# Patient Record
Sex: Female | Born: 1945
Health system: Southern US, Community
[De-identification: ages and names within clinical notes are randomized; demographics above are authoritative.]

## PROBLEM LIST (undated history)

## (undated) DIAGNOSIS — E039 Hypothyroidism, unspecified: Secondary | ICD-10-CM

## (undated) DIAGNOSIS — R351 Nocturia: Secondary | ICD-10-CM

## (undated) DIAGNOSIS — T4145XA Adverse effect of unspecified anesthetic, initial encounter: Secondary | ICD-10-CM

## (undated) DIAGNOSIS — K649 Unspecified hemorrhoids: Secondary | ICD-10-CM

## (undated) DIAGNOSIS — K219 Gastro-esophageal reflux disease without esophagitis: Secondary | ICD-10-CM

## (undated) DIAGNOSIS — I10 Essential (primary) hypertension: Secondary | ICD-10-CM

## (undated) DIAGNOSIS — T8859XA Other complications of anesthesia, initial encounter: Secondary | ICD-10-CM

## (undated) DIAGNOSIS — C210 Malignant neoplasm of anus, unspecified: Secondary | ICD-10-CM

## (undated) DIAGNOSIS — E119 Type 2 diabetes mellitus without complications: Secondary | ICD-10-CM

## (undated) HISTORY — DX: Essential (primary) hypertension: I10

## (undated) HISTORY — PX: APPENDECTOMY: SHX54

## (undated) HISTORY — DX: Unspecified hemorrhoids: K64.9

## (undated) HISTORY — PX: WISDOM TOOTH EXTRACTION: SHX21

---

## 1997-10-10 ENCOUNTER — Ambulatory Visit (HOSPITAL_COMMUNITY): Admission: RE | Admit: 1997-10-10 | Discharge: 1997-10-10 | Payer: Self-pay | Admitting: Family Medicine

## 1998-07-31 ENCOUNTER — Emergency Department (HOSPITAL_COMMUNITY): Admission: EM | Admit: 1998-07-31 | Discharge: 1998-07-31 | Payer: Self-pay

## 1999-02-06 ENCOUNTER — Encounter: Payer: Self-pay | Admitting: Family Medicine

## 1999-02-06 ENCOUNTER — Ambulatory Visit (HOSPITAL_COMMUNITY): Admission: RE | Admit: 1999-02-06 | Discharge: 1999-02-06 | Payer: Self-pay | Admitting: Family Medicine

## 2001-06-06 ENCOUNTER — Encounter: Payer: Self-pay | Admitting: Family Medicine

## 2001-06-06 ENCOUNTER — Encounter: Admission: RE | Admit: 2001-06-06 | Discharge: 2001-06-06 | Payer: Self-pay | Admitting: Family Medicine

## 2002-06-08 ENCOUNTER — Ambulatory Visit (HOSPITAL_COMMUNITY): Admission: RE | Admit: 2002-06-08 | Discharge: 2002-06-08 | Payer: Self-pay | Admitting: Family Medicine

## 2002-06-08 ENCOUNTER — Encounter: Payer: Self-pay | Admitting: Family Medicine

## 2004-02-20 ENCOUNTER — Encounter: Admission: RE | Admit: 2004-02-20 | Discharge: 2004-02-20 | Payer: Self-pay | Admitting: Occupational Medicine

## 2004-10-03 ENCOUNTER — Other Ambulatory Visit: Admission: RE | Admit: 2004-10-03 | Discharge: 2004-10-03 | Payer: Self-pay | Admitting: Family Medicine

## 2005-10-12 ENCOUNTER — Other Ambulatory Visit: Admission: RE | Admit: 2005-10-12 | Discharge: 2005-10-12 | Payer: Self-pay | Admitting: Family Medicine

## 2005-12-11 ENCOUNTER — Encounter: Admission: RE | Admit: 2005-12-11 | Discharge: 2005-12-11 | Payer: Self-pay | Admitting: Family Medicine

## 2006-04-15 ENCOUNTER — Encounter: Admission: RE | Admit: 2006-04-15 | Discharge: 2006-04-15 | Payer: Self-pay | Admitting: Family Medicine

## 2006-04-26 ENCOUNTER — Encounter: Admission: RE | Admit: 2006-04-26 | Discharge: 2006-04-26 | Payer: Self-pay | Admitting: Family Medicine

## 2006-06-11 ENCOUNTER — Encounter: Admission: RE | Admit: 2006-06-11 | Discharge: 2006-06-11 | Payer: Self-pay | Admitting: Otolaryngology

## 2006-07-23 ENCOUNTER — Encounter: Admission: RE | Admit: 2006-07-23 | Discharge: 2006-07-23 | Payer: Self-pay | Admitting: Family Medicine

## 2008-01-30 ENCOUNTER — Other Ambulatory Visit: Admission: RE | Admit: 2008-01-30 | Discharge: 2008-01-30 | Payer: Self-pay | Admitting: Family Medicine

## 2008-02-02 ENCOUNTER — Ambulatory Visit (HOSPITAL_COMMUNITY): Admission: RE | Admit: 2008-02-02 | Discharge: 2008-02-02 | Payer: Self-pay | Admitting: Family Medicine

## 2008-05-17 ENCOUNTER — Ambulatory Visit (HOSPITAL_BASED_OUTPATIENT_CLINIC_OR_DEPARTMENT_OTHER): Admission: RE | Admit: 2008-05-17 | Discharge: 2008-05-17 | Payer: Self-pay | Admitting: Orthopedic Surgery

## 2008-05-17 HISTORY — PX: OTHER SURGICAL HISTORY: SHX169

## 2008-05-23 ENCOUNTER — Encounter (INDEPENDENT_AMBULATORY_CARE_PROVIDER_SITE_OTHER): Payer: Self-pay | Admitting: Orthopedic Surgery

## 2008-05-23 ENCOUNTER — Ambulatory Visit (HOSPITAL_COMMUNITY): Admission: RE | Admit: 2008-05-23 | Discharge: 2008-05-23 | Payer: Self-pay | Admitting: Orthopedic Surgery

## 2008-05-23 ENCOUNTER — Ambulatory Visit: Payer: Self-pay | Admitting: *Deleted

## 2008-06-14 ENCOUNTER — Encounter: Admission: RE | Admit: 2008-06-14 | Discharge: 2008-09-12 | Payer: Self-pay | Admitting: Orthopedic Surgery

## 2009-02-19 ENCOUNTER — Ambulatory Visit (HOSPITAL_COMMUNITY): Admission: RE | Admit: 2009-02-19 | Discharge: 2009-02-19 | Payer: Self-pay | Admitting: Family Medicine

## 2009-02-22 ENCOUNTER — Encounter: Admission: RE | Admit: 2009-02-22 | Discharge: 2009-02-22 | Payer: Self-pay | Admitting: Family Medicine

## 2010-01-30 ENCOUNTER — Other Ambulatory Visit: Admission: RE | Admit: 2010-01-30 | Discharge: 2010-01-30 | Payer: Self-pay | Admitting: Family Medicine

## 2010-02-25 ENCOUNTER — Ambulatory Visit (HOSPITAL_COMMUNITY): Admission: RE | Admit: 2010-02-25 | Discharge: 2010-02-25 | Payer: Self-pay | Admitting: Family Medicine

## 2010-07-20 ENCOUNTER — Encounter: Payer: Self-pay | Admitting: Family Medicine

## 2010-07-21 ENCOUNTER — Encounter: Payer: Self-pay | Admitting: Family Medicine

## 2010-11-11 NOTE — Op Note (Signed)
Destiny Decker, Destiny Decker                  ACCOUNT NO.:  [id number]   MEDICAL RECORD NO.:  [id number]          PATIENT TYPE:  AMB   LOCATION:  [facility]                          FACILITY:  [facility]   PHYSICIAN:  Rodney A. Mortenson, M.D.DATE OF BIRTH:  September 18, 1945   DATE OF PROCEDURE:  05/17/2008  DATE OF DISCHARGE:                               OPERATIVE REPORT   JUSTIFICATION:  A 65 year old female in February 2006, underwent  debridement of lateral meniscus, left knee and debridement of high-grade  chondromalacia, left patella and medial femoral condyle with a tear in  the anterior horn of meniscus at that time.  The patient was not having  pain beginning of this year, and an MRI done in March to rule out  avascular necrosis showed extensive cartilage loss in the posterior  aspect of patella and a new tear of the anterior horn of the lateral  meniscus.  Because of persistent pain and discomfort, she is now  admitted for arthroscopic evaluation and treatment.  Complications  discussed preoperatively.  Questions answered and encouraged.  It was  clearly outlined that if most of pain was due to the arthritic changes,  the debridement of the tear would be insufficient to relieve all her  pain.  She does have a ganglion here and wishes to have intervention.   JUSTIFICATION FOR OUTPATIENT SURGERY:  Minimal morbidity.   PREOPERATIVE DIAGNOSES:  1. Necrotic osteoarthritis, left knee.  2. Tear of anterior horn of lateral meniscus, left knee with a      ganglion cyst underneath anterior horn of lateral meniscus.   POSTOPERATIVE DIAGNOSES:  1. Necrotic osteoarthritis, left knee.  2. Tear of anterior horn of lateral meniscus, left knee with a      ganglion cyst underneath anterior horn of lateral meniscus.   OPERATION:  Debride anterior horn of lateral meniscus and removal of  large ganglion cyst from anterior aspect of the knee.   SURGEON:  Lenard Galloway. Chaney Malling, MD   ANESTHESIA:  MAC.   PATHOLOGY:   With the arthroscope of the knee, a very careful examination  was undertaken.  There was early arthritic changes in the patellofemoral  joint.  The anterior cruciate ligament was normal.  The lateral  compartment had multiple horizontal tears over the anterior horn and  with compression of the anterior horn, fluid which squirt into the joint  underneath the lip of the meniscus, this looked like ganglion, fluid was  very thick gelatinous.  Arthroscope was placed on lateral compartment,  and there was early osteoarthritis with arthritic change over  weightbearing area of the medial femoral condyle and especially  medial  tibial plateau.  The medial meniscus was intact.   PROCEDURE:  The patient was placed on the operating table in supine  position with pneumatic tourniquet about the left upper thigh.  Left leg  was placed in a leg holder.  Entire left lower extremity was prepped  with DuraPrep and draped out in the usual manner.  An infusion cannula  was placed in the superior medial pouch and the knee was distended with  saline.  Anteromedial and anterolateral portal was made.  The  arthroscope was introduced.  The findings were described as above.  All  the pathology was seen in the lateral compartment.  There were multiple  horizontal tears in the anterior horn and through 2 different portals, a  small basket was inserted, and anterior horn was aggressively debrided.  Once this was accomplished to my satisfaction, intra-articular shaver  was introduced, and all remaining frayed edges of the anterior horn were  removed, there was nice transition in mid third of the medial meniscus.  Palpation on the dorsal surface of the remainder of anterior horn with  probe caused extrusion of gelatinous material.  A knife hole was placed  at the meniscal capsular junction at this level and conjoined this,  ganglion type fluid was expressed.  This was opened slightly more and  intra-articular shaver was  inserted through this incision, and the  entire ganglion was excised.  Ganglion was debrided on the undersurface  of the meniscus.  Complete decompression was achieved.  I was very  pleased with final surgical outcome.  Marcaine was then placed, and a  large bulky pressure dressing applied, and the patient returned to the  recovery room in excellent condition.  Technically, this procedure went  extremely well.   DISPOSITION:  1. Usual postop instructions.  2. Percocet for pain.  3. To my office on Wednesday.      Rodney A. Chaney Malling, M.D.  Electronically Signed     RAM/MEDQ  D:  05/17/2008  T:  05/18/2008  Job:  [id number]

## 2011-01-20 ENCOUNTER — Other Ambulatory Visit (HOSPITAL_COMMUNITY): Payer: Self-pay | Admitting: Family Medicine

## 2011-01-20 DIAGNOSIS — Z1231 Encounter for screening mammogram for malignant neoplasm of breast: Secondary | ICD-10-CM

## 2011-02-27 ENCOUNTER — Ambulatory Visit (HOSPITAL_COMMUNITY): Payer: BC Managed Care – PPO | Attending: Family Medicine

## 2011-03-16 ENCOUNTER — Ambulatory Visit (HOSPITAL_COMMUNITY): Payer: BC Managed Care – PPO | Attending: Family Medicine

## 2011-03-31 LAB — BASIC METABOLIC PANEL
CO2: 30
Calcium: 9.3
Chloride: 103
GFR calc Af Amer: >60
Sodium: 139

## 2011-03-31 LAB — POCT HEMOGLOBIN-HEMACUE: Hemoglobin: 14.2

## 2011-12-01 ENCOUNTER — Ambulatory Visit (HOSPITAL_COMMUNITY): Payer: BC Managed Care – PPO | Attending: Family Medicine

## 2011-12-30 ENCOUNTER — Ambulatory Visit (HOSPITAL_COMMUNITY)
Admission: RE | Admit: 2011-12-30 | Discharge: 2011-12-30 | Disposition: A | Discharge status: Home / Self Care | Payer: Medicare Other | Source: Ambulatory Visit | Attending: Family Medicine | Admitting: Family Medicine

## 2011-12-30 DIAGNOSIS — Z1231 Encounter for screening mammogram for malignant neoplasm of breast: Secondary | ICD-10-CM | POA: Insufficient documentation

## 2012-08-16 ENCOUNTER — Other Ambulatory Visit: Payer: Self-pay | Admitting: Gastroenterology

## 2012-08-24 ENCOUNTER — Encounter (INDEPENDENT_AMBULATORY_CARE_PROVIDER_SITE_OTHER): Payer: Self-pay

## 2012-08-25 ENCOUNTER — Ambulatory Visit (INDEPENDENT_AMBULATORY_CARE_PROVIDER_SITE_OTHER): Payer: Medicare PPO | Admitting: General Surgery

## 2012-08-25 ENCOUNTER — Encounter (INDEPENDENT_AMBULATORY_CARE_PROVIDER_SITE_OTHER): Payer: Self-pay | Admitting: General Surgery

## 2012-08-25 VITALS — BP 128/72 | HR 76 | Temp 97.8°F | Resp 16 | Ht 66.0 in | Wt 160.1 lb

## 2012-08-25 DIAGNOSIS — C44520 Squamous cell carcinoma of anal skin: Secondary | ICD-10-CM | POA: Insufficient documentation

## 2012-08-25 NOTE — Patient Instructions (Signed)
GETTING TO GOOD BOWEL HEALTH. Irregular bowel habits such as constipation can lead to many problems over time.  Having one soft bowel movement a day is the most important way to prevent further problems.  The anorectal canal is designed to handle stretching and feces to safely manage our ability to get rid of solid waste (feces, poop, stool) out of our body.  BUT, hard constipated stools can act like ripping concrete bricks causing inflamed hemorrhoids, anal fissures, abdominal pain and bloating.     The goal: ONE SOFT BOWEL MOVEMENT A DAY!  To have soft, regular bowel movements:    Drink at least 8 tall glasses of water a day.     Take plenty of fiber.  Fiber is the undigested part of plant food that passes into the colon, acting s "natures broom" to encourage bowel motility and movement.  Fiber can absorb and hold large amounts of water. This results in a larger, bulkier stool, which is soft and easier to pass. Work gradually over several weeks up to 6 servings a day of fiber (25g a day even more if needed) in the form of: o Vegetables -- Root (potatoes, carrots, turnips), leafy green (lettuce, salad greens, celery, spinach), or cooked high residue (cabbage, broccoli, etc) o Fruit -- Fresh (unpeeled skin & pulp), Dried (prunes, apricots, cherries, etc ),  or stewed ( applesauce)  o Whole grain breads, pasta, etc (whole wheat)  o Bran cereals    Bulking Agents -- This type of water-retaining fiber generally is easily obtained each day by one of the following:  o Psyllium bran -- The psyllium plant is remarkable because its ground seeds can retain so much water. This product is available as Metamucil, Konsyl, Effersyllium, Per Diem Fiber, or the less expensive generic preparation in drug and health food stores. Although labeled a laxative, it really is not a laxative.  o Methylcellulose -- This is another fiber derived from wood which also retains water. It is available as Citrucel. o Polyethylene Glycol  - and "artificial" fiber commonly called Miralax or Glycolax.  It is helpful for people with gassy or bloated feelings with regular fiber o Flax Seed - a less gassy fiber than psyllium   No reading or other relaxing activity while on the toilet. If bowel movements take longer than 5 minutes, you are too constipated   AVOID CONSTIPATION.  High fiber and water intake usually takes care of this.  Sometimes a laxative is needed to stimulate more frequent bowel movements, but    Laxatives are not a good long-term solution as it can wear the colon out. o Osmotics (Milk of Magnesia, Fleets phosphosoda, Magnesium citrate, MiraLax, GoLytely) are safer than  o Stimulants (Senokot, Castor Oil, Dulcolax, Ex Lax)    o Do not take laxatives for more than 7days in a row.    IF SEVERELY CONSTIPATED, try a Bowel Retraining Program: o Do not use laxatives.  o Eat a diet high in roughage, such as bran cereals and leafy vegetables.  o Drink six (6) ounces of prune or apricot juice each morning.  o Eat two (2) large servings of stewed fruit each day.  o Take one (1) heaping tablespoon of a psyllium-based bulking agent twice a day. Use sugar-free sweetener when possible to avoid excessive calories.  o Eat a normal breakfast.  o Set aside 15 minutes after breakfast to sit on the toilet, but do not strain to have a bowel movement.  o If you do   not have a bowel movement by the third day, use an enema and repeat the above steps.    Anal Cancer  What is anal cancer? Cancer describes a set of diseases in which normal cells in the body, through a series of genetic changes, become abnormal and lose the ability to control their growth. As cancers - also known as "malignancies" - grow, they invade the tissues around them (local invasion). They may also spread to other locations in the body via the blood vessels or lymphatic channels where they may implant and grow (metastases). The anus or anal canal is the passage that  connects the rectum, or last part of the large intestine, to the outside of the body. Anal cancer arises from the cells around the anal opening or in the anal canal just inside the anal opening. Anal cancer is often a type of cancer called "squamous cell carcinoma". Other rare types of cancer may also occur in the anal canal and these require consultation with your physician or surgeon to determine the appropriate evaluation and treatment.  Cells that are becoming malignant or "premalignant", but have not invaded deeper into the skin, are referred to as "high-grade anal intraepithelial neoplasia" or HGAIN (previously referred to by a number of different terms, including "high grade dysplasia", "carcinoma-in-situ", "anal intra-epithelial neoplasia grade III", "high-grade squamous intraepithelial lesion", or "Bowen's disease"). While this condition is likely a precursor to anal cancer, this is not anal cancer and is treated differently than anal cancer. Your physician or colon and rectal surgeon can help clarify the differences. How common is anal cancer? Anal cancer is fairly uncommon, and accounts for about 1-2% of cancers affecting the intestinal tract. Approximately one in 600 men and women will get anal cancer in their lifetime (this can be compared to 1 in 69 men and women who will developed colon and rectal cancer in their lifetime). Almost 6,000 new cases of anal cancer are now diagnosed each year in the [place] with about 2/3rds of the cases in women. Approximately 800 people will die of the disease each year. Unlike some cancers, the numbers of patients that develop anal cancer each year is slowly increasing, especially in some higher risk groups (see below).  Who is at risk? A risk factor is something that increases a person's chance of getting a disease. Anal cancer is commonly associated with infection with the human papilloma virus (HPV), but some anal cancers develop without this infection being  present. HPV can also cause warts in and around the anus as well as genital warts (on the penis in men and the vagina or cervix in women), but warts do not have to be present for anal cancer to develop. HPV is also associated with an increased risk of cervical and vaginal or vulvar cancer in women, penile cancer in men, as well as with some head and neck cancers in men and women. Having some of these cancers, especially cervical or vulvar cancer (or even pre-cancerous change in the cervix or vulva), can put people at increased risk for anal cancer - likely from the association with HPV infection.  Additional risk factors for anal cancer include: Age - While most of the cases of anal cancer develop in people over age 22, 1/3rd of the cases occur in patients that are younger than that.  Anal sex - People participating in anal sex, both men and women, are at increased risk.  Sexually transmitted diseases - Patients with multiple sex partners are at  higher risk of getting sexually transmitted diseases like HPV and HIV and are, therefore, at an increased risk of developing anal cancer.  Smoking - Harmful chemicals from smoking increases the risk of most cancers, including anal cancer.  Immunosuppression - People with weakened immune systems, such as transplant patients who must take drugs to suppress their immune systems and patients with HIV infection, are at higher risk.  Chronic local inflammation - People with long-standing anal fistulas or open wounds in the anal area are at a slightly higher risk.  Pelvic radiation - People who have had pelvic radiation therapy for rectal, prostate, bladder or cervical cancer are at increased risk. Can anal cancer be prevented? Few cancers can be totally prevented, but the risk of developing anal cancer may be decreased significantly by avoiding the risk factors listed above and by getting regular checkups. Avoiding anal sex and infection with HPV and HIV can reduce the  risk of developing anal cancer. Using condoms whenever having any kind of intercourse may reduce, but not eliminate, the risk of HPV infection. Smoking cessation lowers the risk of many types of cancer, including anal cancer. Vaccines against infection with certain types of HPV, especially in high-risk patients (see risk factors listed above), may also decrease the risk of developing anal cancer (in men and women). People who are at increased risk for anal cancer based on the risk factors listed above should talk to their doctors about consideration for anal cancer screening. This can include anal cytology or Pap tests (much like the Pap tests women undergo for cervical cancer screening). Early identification and treatment of premalignant lesions in the anus may also prevent the development of anal cancer.  What are the symptoms of anal cancer? Although 20% of anal cancers may be asymptomatic, many cases of anal cancer can be found early because they form in a part of the digestive tract that a doctor can see and reach easily. Anal cancers may cause symptoms such as:  Bleeding from the rectum or anus  The feeling of a lump or mass at the anal opening  Persistent or recurring pain in the anal area  Persistent or recurrent itching  Change in bowel habits (having more or fewer bowel movements) or increased straining during a bowel movement  Narrowing of the stools  Discharge or drainage (mucous or pus) from the anus  Swollen lymph nodes (glands) in the anal or groin areas These symptoms can also be caused by less serious conditions such as hemorrhoids, but you should never assume this. If you have any of these symptoms, see your doctor or colon and rectal surgeon. How is anal cancer diagnosed? Anal cancer is usually found on examination of the anal canal because of the presence of symptoms listed above, on routine yearly physical exams by a physician (rectal exam for prostate check or at the time of a  pelvic exam), or on screening tests such as those recommended for preventing or diagnosing colorectal cancer (for example: colonoscopy or lighted scope exam of the colon and rectum or yearly stool blood tests). Anoscopy, or examination of the anal canal with a small, lighted scope, may be performed as well to assess any abnormal findings. If an abnormal area in the anal canal is identified based on the doctor's exam, a biopsy will be performed to determine the diagnosis. If the diagnosis of anal cancer is confirmed, additional tests to determine the extent of the cancer may be recommended, which may include ultrasounds, Xrays, CT  scans, and/or PET scans.  How are anal cancers treated? Treatment for most cases of anal cancer is very effective in curing the cancer. There are 3 basic types of treatment used for anal cancer:  Surgery - an operation to remove the cancer  Radiation therapy - high-dose x-rays to kill cancer cells  Chemotherapy - giving drugs to kill cancer cells Combination therapy including radiation therapy and chemotherapy is now considered the standard treatment for most anal cancers. Occasionally, a very small or early tumor may be removed surgically (local excision) without the need for further treatment and with minimal damage to the anal sphincter muscles that are important for bowel control. On occasion, more major surgery to remove the anal cancer is needed, and this requires the creation of a colostomy where the bowel is brought out to the skin on the belly wall where a bag is attached to collect the fecal matter. Will I need a colostomy? The majority of patients treated for anal cancer will not need a colostomy. If the tumor does not respond completely to combination therapy, if it recurs after treatment, or if it is an unusual type of anal cancer, removal of the rectum and anus and creation of a permanent colostomy may be necessary. This operation is known as an abdominoperineal  resection (APR). What happens after treatment for anal cancer? Follow-up care to assess the results of treatment and to check for recurrence is very important. Most anal cancers are cured with combination therapy and/or surgery, so you should report any symptoms or problems to your doctor or surgeon right away. In addition, many tumors that recur may be successfully treated with surgery if they are caught early. A careful examination by an experienced physician or colon and rectal surgeon at regular intervals is the most important method of follow-up. Additional studies, such as certain types of scans (for example, CT or MRI) or ultrasounds, may also be recommended.  Conclusion Anal cancers are unusual tumors arising from the skin or lining of the anal canal. As with most cancers, early detection is associated with excellent survival. Most tumors are well-treated with combination chemotherapy and radiation. Recurrences, treatment failures, and advanced disease may require surgery. Follow the recommended screening examinations for anal and colorectal cancer and consult your doctor or colon and rectal surgeon early when any concerning symptoms occur.  What is a colon and rectal surgeon? Colon and rectal surgeons are experts in the surgical and non-surgical treatment of diseases of the colon, rectum and anus. They have completed advanced surgical training in the treatment of these diseases as well as full general surgical training. Board-certified colon and rectal surgeons complete residencies in general surgery and colon and rectal surgery, and pass intensive examinations conducted by the American Board of Surgery and the American Board of Colon and Rectal Surgery. They are well-versed in the treatment of both benign and malignant diseases of the colon, rectum and anus and are able to perform routine screening examinations and surgically treat conditions if indicated to do so. Thereasa ParkinAlgis Downs. Weston Settle, MD, FACS,  FASCRS, on behalf of the [employer] Public Relations Committee

## 2012-08-25 NOTE — Progress Notes (Signed)
Chief Complaint  Patient presents with  . New Evaluation    Squamous cell carcinoma    HISTORY: Destiny Decker is a 67 y.o. female who presents to the office with rectal irritation and a mass.  Other symptoms include bleeding (q 2-3 mo).  This had been occurring for years.  She has tried hemorrhoid creams in the past with some success.  Hard BM's makes the symptoms worse.   It is intermittent in nature.  Her bowel habits are irregular and her bowel movements are mostly hard.  Her fiber intake is moderate in her diet.  Her last colonoscopy was recently and showed diverticulosis, sigmoid polyp, anal polyp.  She does have prolapsing tissue.     Past Medical History  Diagnosis Date  . Menopause   . Hypertension   . Diabetes mellitus without complication   . Thyroid disease     Hypothyroidism  . Hemorrhoids   . Squamous cell carcinoma       Past Surgical History  Procedure Laterality Date  . Knee arthroscopy Left 2006, 2009  . Wisdom tooth extraction    . Appendectomy      When she was 67 years old.        Current Outpatient Prescriptions  Medication Sig Dispense Refill  . ONE TOUCH ULTRA TEST test strip daily.      Marland Kitchen amLODipine (NORVASC) 5 MG tablet Take 5 mg by mouth daily.      . Aspirin 81 MG EC tablet Take 81 mg by mouth daily.      . diclofenac (VOLTAREN) 75 MG EC tablet Take 75 mg by mouth 2 (two) times daily.      . fish oil-omega-3 fatty acids 1000 MG capsule Take 1 g by mouth 2 (two) times daily.      Marland Kitchen ibuprofen (ADVIL,MOTRIN) 200 MG tablet Take 600 mg by mouth as needed for pain.      Marland Kitchen levothyroxine (SYNTHROID, LEVOTHROID) 50 MCG tablet Take 50 mcg by mouth daily.      . metFORMIN (GLUCOPHAGE) 500 MG tablet Take 500 mg by mouth 2 (two) times daily with a meal.      . Omega-3 Fat Ac-Cholecalciferol (MINICAPS VITAMIN-D/OMEGA-3 PO) Take 1,000 Units by mouth daily.       No current facility-administered medications for this visit.      Allergies  Allergen Reactions  .  Penicillins Itching    In groin area only.      Family History  Problem Relation Age of Onset  . Diabetes Mother   . Cancer Sister     breast  . Hypertension Sister     History   Social History  . Marital Status: Married    Spouse Name: N/A    Number of Children: N/A  . Years of Education: N/A   Social History Main Topics  . Smoking status: Never Smoker   . Smokeless tobacco: Never Used  . Alcohol Use: No  . Drug Use: No  . Sexually Active: None   Other Topics Concern  . None   Social History Narrative  . None      REVIEW OF SYSTEMS - PERTINENT POSITIVES ONLY: Review of Systems - General ROS: negative for - chills, fever or weight loss Hematological and Lymphatic ROS: negative for - bleeding problems, blood clots or bruising Respiratory ROS: no cough, shortness of breath, or wheezing Cardiovascular ROS: no chest pain or dyspnea on exertion Gastrointestinal ROS: positive for - abdominal pain and gas/bloating negative for -  diarrhea or nausea/vomiting Genito-Urinary ROS: no dysuria, trouble voiding, or hematuria  EXAM: Filed Vitals:   08/25/12 1442  BP: 128/72  Pulse: 76  Temp: 97.8 F (36.6 C)  Resp: 16    General appearance: alert and cooperative Resp: clear to auscultation bilaterally Cardio: regular rate and rhythm GI: soft, non-tender; bowel sounds normal; no masses,  no organomegaly   Procedure: Anoscopy Surgeon: Maisie Fus Diagnosis: anal mass  Assistant: Glaspey After the risks and benefits were explained, verbal consent was obtained for above procedure  Anesthesia: none Findings: polypoid anal mass, L lateral position, mobile    ASSESSMENT AND PLAN: Destiny Decker is a 67 y.o. with a mass biopsied on colonoscopy that showed anal SCC.  On exam this area is discretely present.  She has a history of constipation and hard stools as well.  I have asked her to start a fiber supplement daily in preparation for wide local excision in a couple weeks.  We  will re-evaluate the cancer once we get her final pathology back.    The risks of the procedure are pain, bleeding, urinary retention, cancer recurrence and scarring.  I believe she understands these risks and agrees to proceed.     [name], MD Colon and Rectal Surgery / General Surgery [facility]      Visit Diagnoses: 1. Squamous cell carcinoma of anal skin     Primary Care Physician: Benita Stabile, MD

## 2012-09-07 ENCOUNTER — Encounter (HOSPITAL_BASED_OUTPATIENT_CLINIC_OR_DEPARTMENT_OTHER): Payer: Self-pay | Admitting: *Deleted

## 2012-09-08 ENCOUNTER — Encounter (HOSPITAL_BASED_OUTPATIENT_CLINIC_OR_DEPARTMENT_OTHER): Payer: Self-pay | Admitting: *Deleted

## 2012-09-09 ENCOUNTER — Encounter (HOSPITAL_BASED_OUTPATIENT_CLINIC_OR_DEPARTMENT_OTHER): Payer: Self-pay | Admitting: *Deleted

## 2012-09-09 NOTE — Progress Notes (Signed)
NPO AFTER MN. ARRIVES AT 0615. NEEDS ISTAT AND EKG. WILL TAKE NORVASC AND SYNTHROID AM OF SURG W/ SIP OF WATER AND DO FLEET ENEMA. [place] ORIGIN, PT SPEAKS AND UNDERSTANDS GOOD ENGLISH BUT UNSURE ABOUT DIRECTIONS OF FACILITY AND WHERE TO GET ENEMA, SPOKE W/ PT HUSBAND AND GAVE INSTRUCTIONS , VERBALIZED UNDERSTANDING. PT STATES SHE DOES NOT NEED INTERPRETOR DOS.

## 2012-09-11 NOTE — Anesthesia Preprocedure Evaluation (Addendum)
Anesthesia Evaluation  Patient identified by MRN, date of birth, ID band Patient awake    Reviewed: Allergy & Precautions, H&P , NPO status , Patient's Chart, lab work & pertinent test results  Airway Mallampati: II TM Distance: >3 FB Neck ROM: full    Dental no notable dental hx. (+) Dental Advisory Given and Missing All right lateral and side teeth are missing.  A few missing teeth on left side too.  Front OK:   Pulmonary neg pulmonary ROS,  breath sounds clear to auscultation  Pulmonary exam normal       Cardiovascular Exercise Tolerance: Good hypertension, Pt. on medications negative cardio ROS  Rhythm:regular Rate:Normal     Neuro/Psych negative neurological ROS  negative psych ROS   GI/Hepatic negative GI ROS, Neg liver ROS, GERD-  Controlled,  Endo/Other  negative endocrine ROSdiabetes, Well Controlled, Type 2, Oral Hypoglycemic AgentsHypothyroidism   Renal/GU negative Renal ROS  negative genitourinary   Musculoskeletal   Abdominal   Peds  Hematology negative hematology ROS (+)   Anesthesia Other Findings   Reproductive/Obstetrics negative OB ROS                          Anesthesia Physical Anesthesia Plan  ASA: III  Anesthesia Plan: General   Post-op Pain Management:    Induction: Intravenous  Airway Management Planned: Oral ETT  Additional Equipment:   Intra-op Plan:   Post-operative Plan: Extubation in OR  Informed Consent: I have reviewed the patients History and Physical, chart, labs and discussed the procedure including the risks, benefits and alternatives for the proposed anesthesia with the patient or authorized representative who has indicated his/her understanding and acceptance.   Dental Advisory Given  Plan Discussed with: CRNA and Surgeon  Anesthesia Plan Comments:        Anesthesia Quick Evaluation

## 2012-09-12 ENCOUNTER — Other Ambulatory Visit: Payer: Self-pay

## 2012-09-12 ENCOUNTER — Encounter (HOSPITAL_BASED_OUTPATIENT_CLINIC_OR_DEPARTMENT_OTHER)
Admission: RE | Disposition: A | Discharge status: Home / Self Care | Payer: Self-pay | Source: Ambulatory Visit | Attending: General Surgery

## 2012-09-12 ENCOUNTER — Encounter (HOSPITAL_BASED_OUTPATIENT_CLINIC_OR_DEPARTMENT_OTHER): Payer: Self-pay | Admitting: Anesthesiology

## 2012-09-12 ENCOUNTER — Ambulatory Visit (HOSPITAL_BASED_OUTPATIENT_CLINIC_OR_DEPARTMENT_OTHER)
Admission: RE | Admit: 2012-09-12 | Discharge: 2012-09-12 | Disposition: A | Discharge status: Home / Self Care | Payer: Medicare PPO | Source: Ambulatory Visit | Attending: General Surgery | Admitting: General Surgery

## 2012-09-12 ENCOUNTER — Ambulatory Visit (HOSPITAL_BASED_OUTPATIENT_CLINIC_OR_DEPARTMENT_OTHER): Payer: Medicare PPO | Admitting: Anesthesiology

## 2012-09-12 ENCOUNTER — Encounter (HOSPITAL_BASED_OUTPATIENT_CLINIC_OR_DEPARTMENT_OTHER): Payer: Self-pay | Admitting: *Deleted

## 2012-09-12 DIAGNOSIS — D013 Carcinoma in situ of anus and anal canal: Secondary | ICD-10-CM

## 2012-09-12 DIAGNOSIS — K6289 Other specified diseases of anus and rectum: Secondary | ICD-10-CM | POA: Insufficient documentation

## 2012-09-12 DIAGNOSIS — Z79899 Other long term (current) drug therapy: Secondary | ICD-10-CM | POA: Insufficient documentation

## 2012-09-12 DIAGNOSIS — E039 Hypothyroidism, unspecified: Secondary | ICD-10-CM | POA: Insufficient documentation

## 2012-09-12 DIAGNOSIS — K6282 Dysplasia of anus: Secondary | ICD-10-CM

## 2012-09-12 DIAGNOSIS — I10 Essential (primary) hypertension: Secondary | ICD-10-CM | POA: Insufficient documentation

## 2012-09-12 DIAGNOSIS — E119 Type 2 diabetes mellitus without complications: Secondary | ICD-10-CM | POA: Insufficient documentation

## 2012-09-12 HISTORY — DX: Hypothyroidism, unspecified: E03.9

## 2012-09-12 HISTORY — DX: Nocturia: R35.1

## 2012-09-12 HISTORY — DX: Malignant neoplasm of anus, unspecified: C21.0

## 2012-09-12 HISTORY — DX: Gastro-esophageal reflux disease without esophagitis: K21.9

## 2012-09-12 HISTORY — PX: TRANSANAL EXCISION OF RECTAL MASS: SHX6134

## 2012-09-12 LAB — POCT I-STAT 4, (NA,K, GLUC, HGB,HCT)
HCT: 42 % (ref 36.0–46.0)
Hemoglobin: 14.3 g/dL (ref 12.0–15.0)
Potassium: 3.8 mEq/L (ref 3.5–5.1)
Sodium: 141 mEq/L (ref 135–145)

## 2012-09-12 SURGERY — EXCISION, MASS, RECTUM, ANAL APPROACH
Anesthesia: General | Site: Anus | Wound class: Contaminated

## 2012-09-12 MED ORDER — BUPIVACAINE LIPOSOME 1.3 % IJ SUSP
INTRAMUSCULAR | Status: DC | PRN
  Administered 2012-09-12: 20 mL

## 2012-09-12 MED ORDER — PROPOFOL 10 MG/ML IV BOLUS
INTRAVENOUS | Status: DC | PRN
  Administered 2012-09-12: 180 mg via INTRAVENOUS

## 2012-09-12 MED ORDER — PSYLLIUM 28.3 % PO POWD: 1.0000 "application " | Freq: Two times a day (BID) | ORAL | Status: DC

## 2012-09-12 MED ORDER — KETOROLAC TROMETHAMINE 30 MG/ML IJ SOLN
INTRAMUSCULAR | Status: DC | PRN
  Administered 2012-09-12: 30 mg via INTRAVENOUS

## 2012-09-12 MED ORDER — LIDOCAINE 5 % EX OINT
TOPICAL_OINTMENT | CUTANEOUS | Status: DC | PRN
  Administered 2012-09-12: 1

## 2012-09-12 MED ORDER — FLEET ENEMA 7-19 GM/118ML RE ENEM
1.0000 | ENEMA | Freq: Once | RECTAL | Status: DC
  Filled 2012-09-12: qty 1

## 2012-09-12 MED ORDER — SODIUM CHLORIDE 0.9 % IJ SOLN
3.0000 mL | Freq: Two times a day (BID) | INTRAMUSCULAR | Status: DC
  Filled 2012-09-12: qty 3

## 2012-09-12 MED ORDER — SODIUM CHLORIDE 0.9 % IJ SOLN
3.0000 mL | INTRAMUSCULAR | Status: DC | PRN
  Filled 2012-09-12: qty 3

## 2012-09-12 MED ORDER — ACETIC ACID 5 % SOLN
Status: DC | PRN
  Administered 2012-09-12: 1 via TOPICAL

## 2012-09-12 MED ORDER — SODIUM CHLORIDE 0.9 % IV SOLN
250.0000 mL | INTRAVENOUS | Status: DC | PRN
  Filled 2012-09-12: qty 250

## 2012-09-12 MED ORDER — ONDANSETRON HCL 4 MG/2ML IJ SOLN
4.0000 mg | Freq: Four times a day (QID) | INTRAMUSCULAR | Status: DC | PRN
  Filled 2012-09-12: qty 2

## 2012-09-12 MED ORDER — ACETAMINOPHEN 325 MG PO TABS
650.0000 mg | ORAL_TABLET | ORAL | Status: DC | PRN
  Filled 2012-09-12: qty 2

## 2012-09-12 MED ORDER — DEXAMETHASONE SODIUM PHOSPHATE 4 MG/ML IJ SOLN
INTRAMUSCULAR | Status: DC | PRN
  Administered 2012-09-12: 10 mg via INTRAVENOUS

## 2012-09-12 MED ORDER — BUPIVACAINE LIPOSOME 1.3 % IJ SUSP
20.0000 mL | INTRAMUSCULAR | Status: DC
  Filled 2012-09-12: qty 20

## 2012-09-12 MED ORDER — OXYCODONE HCL 5 MG PO TABS: 5.0000 mg | ORAL_TABLET | ORAL | Status: DC | PRN

## 2012-09-12 MED ORDER — ACETAMINOPHEN 650 MG RE SUPP
650.0000 mg | RECTAL | Status: DC | PRN
  Filled 2012-09-12: qty 1

## 2012-09-12 MED ORDER — DOCUSATE SODIUM 100 MG PO CAPS: 100.0000 mg | ORAL_CAPSULE | Freq: Three times a day (TID) | ORAL | Status: DC

## 2012-09-12 MED ORDER — LACTATED RINGERS IV SOLN
INTRAVENOUS | Status: DC
  Filled 2012-09-12: qty 1000

## 2012-09-12 MED ORDER — SUCCINYLCHOLINE CHLORIDE 20 MG/ML IJ SOLN
INTRAMUSCULAR | Status: DC | PRN
  Administered 2012-09-12: 120 mg via INTRAVENOUS

## 2012-09-12 MED ORDER — ONDANSETRON HCL 4 MG/2ML IJ SOLN
INTRAMUSCULAR | Status: DC | PRN
  Administered 2012-09-12: 4 mg via INTRAVENOUS

## 2012-09-12 MED ORDER — FENTANYL CITRATE 0.05 MG/ML IJ SOLN
INTRAMUSCULAR | Status: DC | PRN
  Administered 2012-09-12: 100 ug via INTRAVENOUS
  Administered 2012-09-12 (×2): 50 ug via INTRAVENOUS

## 2012-09-12 MED ORDER — LACTATED RINGERS IV SOLN
INTRAVENOUS | Status: DC
  Administered 2012-09-12: 07:00:00 via INTRAVENOUS
  Filled 2012-09-12: qty 1000

## 2012-09-12 MED ORDER — FENTANYL CITRATE 0.05 MG/ML IJ SOLN
25.0000 ug | INTRAMUSCULAR | Status: DC | PRN
  Filled 2012-09-12: qty 1

## 2012-09-12 MED ORDER — BUPIVACAINE-EPINEPHRINE PF 0.5-1:200000 % IJ SOLN
INTRAMUSCULAR | Status: DC | PRN
  Administered 2012-09-12: 1 mL

## 2012-09-12 MED ORDER — LACTATED RINGERS IV SOLN
INTRAVENOUS | Status: DC | PRN
  Administered 2012-09-12: 07:00:00 via INTRAVENOUS

## 2012-09-12 MED ORDER — GLYCOPYRROLATE 0.2 MG/ML IJ SOLN
INTRAMUSCULAR | Status: DC | PRN
  Administered 2012-09-12: 0.2 mg via INTRAVENOUS

## 2012-09-12 MED ORDER — LIDOCAINE HCL (CARDIAC) 20 MG/ML IV SOLN
INTRAVENOUS | Status: DC | PRN
  Administered 2012-09-12: 100 mg via INTRAVENOUS

## 2012-09-12 MED ORDER — DIAZEPAM 5 MG PO TABS: 5.0000 mg | ORAL_TABLET | Freq: Four times a day (QID) | ORAL | Status: DC | PRN

## 2012-09-12 MED ORDER — OXYCODONE HCL 5 MG PO TABS
5.0000 mg | ORAL_TABLET | ORAL | Status: DC | PRN
  Filled 2012-09-12: qty 2

## 2012-09-12 MED ORDER — MIDAZOLAM HCL 5 MG/5ML IJ SOLN
INTRAMUSCULAR | Status: DC | PRN
  Administered 2012-09-12: 2 mg via INTRAVENOUS

## 2012-09-12 SURGICAL SUPPLY — 52 items
BENZOIN TINCTURE PRP APPL 2/3 (GAUZE/BANDAGES/DRESSINGS) ×2 IMPLANT
BLADE HEX COATED 2.75 (ELECTRODE) ×2 IMPLANT
BLADE SURG 10 STRL SS (BLADE) ×2 IMPLANT
BLADE SURG 15 STRL LF DISP TIS (BLADE) ×1 IMPLANT
BLADE SURG 15 STRL SS (BLADE) ×1
CANISTER SUCTION 2500CC (MISCELLANEOUS) ×2 IMPLANT
CLOTH BEACON ORANGE TIMEOUT ST (SAFETY) ×2 IMPLANT
COVER MAYO STAND STRL (DRAPES) ×2 IMPLANT
COVER TABLE BACK 60X90 (DRAPES) ×2 IMPLANT
DECANTER SPIKE VIAL GLASS SM (MISCELLANEOUS) ×2 IMPLANT
DRAPE LG THREE QUARTER DISP (DRAPES) ×4 IMPLANT
DRAPE PED LAPAROTOMY (DRAPES) ×2 IMPLANT
DRAPE UNDERBUTTOCKS STRL (DRAPE) IMPLANT
DRSG PAD ABDOMINAL 8X10 ST (GAUZE/BANDAGES/DRESSINGS) ×2 IMPLANT
ELECT BLADE 6.5 .24CM SHAFT (ELECTRODE) IMPLANT
ELECT REM PT RETURN 9FT ADLT (ELECTROSURGICAL) ×2
ELECTRODE REM PT RTRN 9FT ADLT (ELECTROSURGICAL) ×1 IMPLANT
GAUZE SPONGE 4X4 12PLY STRL LF (GAUZE/BANDAGES/DRESSINGS) ×2 IMPLANT
GAUZE SPONGE 4X4 16PLY XRAY LF (GAUZE/BANDAGES/DRESSINGS) IMPLANT
GAUZE VASELINE 3X9 (GAUZE/BANDAGES/DRESSINGS) IMPLANT
GLOVE BIO SURGEON STRL SZ 6.5 (GLOVE) ×4 IMPLANT
GLOVE BIO SURGEON STRL SZ7 (GLOVE) ×2 IMPLANT
GLOVE BIOGEL PI IND STRL 7.0 (GLOVE) ×4 IMPLANT
GLOVE BIOGEL PI INDICATOR 7.0 (GLOVE) ×4
GOWN PREVENTION PLUS LG XLONG (DISPOSABLE) ×2 IMPLANT
GOWN PREVENTION PLUS XLARGE (GOWN DISPOSABLE) ×2 IMPLANT
GOWN STRL REIN XL XLG (GOWN DISPOSABLE) ×2 IMPLANT
LEGGING LITHOTOMY PAIR STRL (DRAPES) IMPLANT
MARCAINE0.5% WITH EPI IMPLANT
NDL SAFETY ECLIPSE 18X1.5 (NEEDLE) IMPLANT
NEEDLE HYPO 18GX1.5 SHARP (NEEDLE)
NEEDLE HYPO 25X1 1.5 SAFETY (NEEDLE) ×2 IMPLANT
NS IRRIG 500ML POUR BTL (IV SOLUTION) ×2 IMPLANT
PACK BASIN DAY SURGERY FS (CUSTOM PROCEDURE TRAY) ×2 IMPLANT
PENCIL BUTTON HOLSTER BLD 10FT (ELECTRODE) ×2 IMPLANT
SCOPETTES 8  STERILE (MISCELLANEOUS) ×1
SCOPETTES 8 STERILE (MISCELLANEOUS) ×1 IMPLANT
SPONGE GAUZE 4X4 12PLY (GAUZE/BANDAGES/DRESSINGS) ×2 IMPLANT
SPONGE SURGIFOAM ABS GEL 12-7 (HEMOSTASIS) IMPLANT
SUT CHROMIC 2 0 SH (SUTURE) ×2 IMPLANT
SUT CHROMIC 3 0 SH 27 (SUTURE) IMPLANT
SUT MON AB 3-0 SH 27 (SUTURE) ×1
SUT MON AB 3-0 SH27 (SUTURE) ×1 IMPLANT
SUT VIC AB 4-0 P-3 18XBRD (SUTURE) IMPLANT
SUT VIC AB 4-0 P3 18 (SUTURE)
SYR BULB IRRIGATION 50ML (SYRINGE) ×2 IMPLANT
SYR CONTROL 10ML LL (SYRINGE) ×4 IMPLANT
TOWEL NATURAL 6PK STERILE (DISPOSABLE) ×2 IMPLANT
TOWEL OR 17X26 10 PK STRL BLUE (TOWEL DISPOSABLE) ×4 IMPLANT
TRAY DSU PREP LF (CUSTOM PROCEDURE TRAY) ×2 IMPLANT
TUBE CONNECTING 12X1/4 (SUCTIONS) ×2 IMPLANT
YANKAUER SUCT BULB TIP NO VENT (SUCTIONS) ×2 IMPLANT

## 2012-09-12 NOTE — Transfer of Care (Signed)
Immediate Anesthesia Transfer of Care Note  Patient: Destiny Decker  Procedure(s) Performed: Procedure(s) (LRB): TRANSANAL EXCISION OF RECTAL MASS (N/A) HIGH RESOLUTION ANOSCOPY (N/A)  Patient Location: PACU  Anesthesia Type: General  Level of Consciousness: awake, sedated, patient cooperative and responds to stimulation  Airway & Oxygen Therapy: Patient Spontanous Breathing and Patient connected to face mask oxygen  Post-op Assessment: Report given to PACU RN, Post -op Vital signs reviewed and stable and Patient moving all extremities  Post vital signs: Reviewed and stable  Complications: No apparent anesthesia complications

## 2012-09-12 NOTE — Interval H&P Note (Signed)
History and Physical Interval Note:  09/12/2012 7:14 AM  Destiny Decker  has presented today for surgery, with the diagnosis of anal squamous cell carcinoma  The various methods of treatment have been discussed with the patient and family. After consideration of risks, benefits and other options for treatment, the patient has consented to  Procedure(s): TRANSANAL EXCISION OF RECTAL MASS (N/A) HIGH RESOLUTION ANOSCOPY (N/A) as a surgical intervention .  The patient's history has been reviewed, patient examined, no change in status, stable for surgery.  I have reviewed the patient's chart and labs.  Questions were answered to the patient's satisfaction.     [name], MD  Colorectal and General Surgery [facility]

## 2012-09-12 NOTE — Anesthesia Procedure Notes (Signed)
Procedure Name: Intubation Date/Time: 09/12/2012 7:30 AM Performed by: Jessica Priest Pre-anesthesia Checklist: Patient identified, Emergency Drugs available, Suction available and Patient being monitored Patient Re-evaluated:Patient Re-evaluated prior to inductionOxygen Delivery Method: Circle System Utilized Preoxygenation: Pre-oxygenation with 100% oxygen Intubation Type: IV induction Ventilation: Mask ventilation without difficulty Laryngoscope Size: Mac and 3 Grade View: Grade I Tube type: Oral Tube size: 7.0 mm Number of attempts: 1 Airway Equipment and Method: stylet and oral airway Placement Confirmation: ETT inserted through vocal cords under direct vision,  positive ETCO2 and breath sounds checked- equal and bilateral Secured at: 22 (22) cm Tube secured with: Tape Dental Injury: Teeth and Oropharynx as per pre-operative assessment

## 2012-09-12 NOTE — H&P (View-Only) (Signed)
Chief Complaint  Patient presents with  . New Evaluation    Squamous cell carcinoma    HISTORY: Destiny Decker is a 67 y.o. female who presents to the office with rectal irritation and a mass.  Other symptoms include bleeding (q 2-3 mo).  This had been occurring for years.  She has tried hemorrhoid creams in the past with some success.  Hard BM's makes the symptoms worse.   It is intermittent in nature.  Her bowel habits are irregular and her bowel movements are mostly hard.  Her fiber intake is moderate in her diet.  Her last colonoscopy was recently and showed diverticulosis, sigmoid polyp, anal polyp.  She does have prolapsing tissue.     Past Medical History  Diagnosis Date  . Menopause   . Hypertension   . Diabetes mellitus without complication   . Thyroid disease     Hypothyroidism  . Hemorrhoids   . Squamous cell carcinoma       Past Surgical History  Procedure Laterality Date  . Knee arthroscopy Left 2006, 2009  . Wisdom tooth extraction    . Appendectomy      When she was 67 years old.        Current Outpatient Prescriptions  Medication Sig Dispense Refill  . ONE TOUCH ULTRA TEST test strip daily.      Marland Kitchen amLODipine (NORVASC) 5 MG tablet Take 5 mg by mouth daily.      . Aspirin 81 MG EC tablet Take 81 mg by mouth daily.      . diclofenac (VOLTAREN) 75 MG EC tablet Take 75 mg by mouth 2 (two) times daily.      . fish oil-omega-3 fatty acids 1000 MG capsule Take 1 g by mouth 2 (two) times daily.      Marland Kitchen ibuprofen (ADVIL,MOTRIN) 200 MG tablet Take 600 mg by mouth as needed for pain.      Marland Kitchen levothyroxine (SYNTHROID, LEVOTHROID) 50 MCG tablet Take 50 mcg by mouth daily.      . metFORMIN (GLUCOPHAGE) 500 MG tablet Take 500 mg by mouth 2 (two) times daily with a meal.      . Omega-3 Fat Ac-Cholecalciferol (MINICAPS VITAMIN-D/OMEGA-3 PO) Take 1,000 Units by mouth daily.       No current facility-administered medications for this visit.      Allergies  Allergen Reactions  .  Penicillins Itching    In groin area only.      Family History  Problem Relation Age of Onset  . Diabetes Mother   . Cancer Sister     breast  . Hypertension Sister     History   Social History  . Marital Status: Married    Spouse Name: N/A    Number of Children: N/A  . Years of Education: N/A   Social History Main Topics  . Smoking status: Never Smoker   . Smokeless tobacco: Never Used  . Alcohol Use: No  . Drug Use: No  . Sexually Active: None   Other Topics Concern  . None   Social History Narrative  . None      REVIEW OF SYSTEMS - PERTINENT POSITIVES ONLY: Review of Systems - General ROS: negative for - chills, fever or weight loss Hematological and Lymphatic ROS: negative for - bleeding problems, blood clots or bruising Respiratory ROS: no cough, shortness of breath, or wheezing Cardiovascular ROS: no chest pain or dyspnea on exertion Gastrointestinal ROS: positive for - abdominal pain and gas/bloating negative for -  diarrhea or nausea/vomiting Genito-Urinary ROS: no dysuria, trouble voiding, or hematuria  EXAM: Filed Vitals:   08/25/12 1442  BP: 128/72  Pulse: 76  Temp: 97.8 F (36.6 C)  Resp: 16    General appearance: alert and cooperative Resp: clear to auscultation bilaterally Cardio: regular rate and rhythm GI: soft, non-tender; bowel sounds normal; no masses,  no organomegaly   Procedure: Anoscopy Surgeon: Maisie Fus Diagnosis: anal mass  Assistant: Glaspey After the risks and benefits were explained, verbal consent was obtained for above procedure  Anesthesia: none Findings: polypoid anal mass, L lateral position, mobile    ASSESSMENT AND PLAN: Destiny Decker is a 67 y.o. with a mass biopsied on colonoscopy that showed anal SCC.  On exam this area is discretely present.  She has a history of constipation and hard stools as well.  I have asked her to start a fiber supplement daily in preparation for wide local excision in a couple weeks.  We  will re-evaluate the cancer once we get her final pathology back.    The risks of the procedure are pain, bleeding, urinary retention, cancer recurrence and scarring.  I believe she understands these risks and agrees to proceed.     [name], MD Colon and Rectal Surgery / General Surgery [facility]      Visit Diagnoses: 1. Squamous cell carcinoma of anal skin     Primary Care Physician: Benita Stabile, MD

## 2012-09-12 NOTE — Progress Notes (Signed)
Report not given to Lolita Rieger RN  due to not being able to receive report in phase 2  @ this time.

## 2012-09-12 NOTE — Progress Notes (Signed)
Dr Rica Mast informed of pt transferred to phase 2 w 02 @ 1L /min. Pt negative for pulmonary disease process, negative for smoking. Notified of pt receiving versed 2mg  & fentanyl intraop. No new orders & pt may remain in phase 2 until weaned off 02 & 02 sats 90% or greater.

## 2012-09-12 NOTE — Op Note (Signed)
09/12/2012  8:29 AM  PATIENT:  Destiny Decker  67 y.o. female  Patient Care Team: Benita Stabile, MD as PCP - General (Family Medicine)  PRE-OPERATIVE DIAGNOSIS:  anal squamous cell carcinoma  POST-OPERATIVE DIAGNOSIS:  anal squamous cell carcinoma  PROCEDURE:  Procedure(s): TRANSANAL EXCISION OF RECTAL MASS HIGH RESOLUTION ANOSCOPY WITH BIOPSY  SURGEON:  Surgeon(s): Romie Levee, MD  ASSISTANT: none   ANESTHESIA:   local and general  EBL: min     SPECIMEN:  Source of Specimen:  anal canal - see below  DISPOSITION OF SPECIMEN:  PATHOLOGY  COUNTS:  YES  PLAN OF CARE: Discharge to home after PACU  PATIENT DISPOSITION:  PACU - hemodynamically stable.  INDICATION: This is a 66 y.o. F with a anal canal lesion that was biopsied on routine colonoscopy and found to have SCC within it.  OR FINDINGS: L posterior mass, not fixed to underlying tissue, posterior anal canal lesions found on high resolution anoscopy  DESCRIPTION: The patient was identified in the preoperative holding area and taken to the OR.  General anesthesia was smoothly induced.  She was laid prone on the operating room table in jack knife position.  The patient was then prepped and draped in the usual sterile fashion. A surgical timeout was performed indicating the correct patient, procedure, positioning and preoperative antibioitics. SCDs were noted to be in place and functioning prior to the operation.   After this was completed, a digital exam was performed.  A Hill Ferguson anoscope was inserted into the anal canal and all 4 quadrants were inspected.  No other suspicious lesions were noted.  The mass occupied the Left anterior position and spread towards the left lateral portion of her anal canal.  A sponge was soaked in 5% acetic acid was placed inside the anal canal.  Another acetic acid soaked sponge was placed over the perianal region. This was allowed to soak for 2 minutes. The sponge was removed and the  perianal region was painted with betadine and evaluated.  There were no suspicious lesions noted.  The internal anal canal was evaluated via anoscopy with a Hill-Ferguson anoscope and painted with betadine.  There were several small lesions noted in the posterior anal canal and the lesion in the left anterior anal canal.  The posterior anal canal lesions were biopsied with scissors and the remaining areas were destroyed with electrocautery.  A Buie clamp was placed around the left anterior lesion and transected with a 10 blade scalpel.  The internal portion was sutured closed using a 2-0 chromic suture in a running fashion.  Additional left anterior and left lateral margins were taken above the dentate line and sent as separate specimens.  Please note that specimen labelled left posterior additional margin was actually left anterior additional margin.  After this was completed, The remaining anal tissue was sutured closed with a 3-0 Chromic suture in a running fashion.  One separate 3-0 Chromic suture was used for hemostasis of the perinanal tissue.  30ml of Exparel was infused into the anal canal for a rectal block.  Hemostasis was good at this time.  A sterile dressing was applied.  The patient was then awakened from anesthesia and sent to the postanesthesia care unit in stable condition. All counts were correct operating room staff.

## 2012-09-12 NOTE — Anesthesia Postprocedure Evaluation (Signed)
Anesthesia Post Note  Patient: Destiny Decker  Procedure(s) Performed: Procedure(s) (LRB): TRANSANAL EXCISION OF RECTAL MASS (N/A) HIGH RESOLUTION ANOSCOPY (N/A)  Anesthesia type: General  Patient location: PACU  Post pain: Pain level controlled  Post assessment: Post-op Vital signs reviewed  Last Vitals:  Filed Vitals:   09/12/12 0850  BP:   Pulse:   Temp: 36.4 C  Resp:     Post vital signs: Reviewed  Level of consciousness: sedated  Complications: No apparent anesthesia complications

## 2012-09-13 ENCOUNTER — Telehealth (INDEPENDENT_AMBULATORY_CARE_PROVIDER_SITE_OTHER): Payer: Self-pay

## 2012-09-13 ENCOUNTER — Encounter (HOSPITAL_BASED_OUTPATIENT_CLINIC_OR_DEPARTMENT_OTHER): Payer: Self-pay | Admitting: General Surgery

## 2012-09-13 NOTE — Telephone Encounter (Signed)
I called to give the pt the message about her path.  Her mail box is full.  I also was not able to get through her home #.

## 2012-09-13 NOTE — Telephone Encounter (Signed)
Message copied by Ivory Broad on Tue Sep 13, 2012  3:48 PM ------      Message from: [place], Broadus John      Created: Tue Sep 13, 2012 12:13 PM      Regarding: Pt results       She did not have cancer, just precancerous lesions.  Looks like we got everything.  We will just need to keep a close eye on it in the future.            AT      ----- Message -----         From: [facility]         Sent: 09/13/2012  10:38 AM           To: Romie Levee, MD                   ------

## 2012-09-14 ENCOUNTER — Telehealth (INDEPENDENT_AMBULATORY_CARE_PROVIDER_SITE_OTHER): Payer: Self-pay

## 2012-09-14 NOTE — Telephone Encounter (Signed)
Message copied by Ivory Broad on Wed Sep 14, 2012 10:05 AM ------      Message from: [place], Broadus John      Created: Tue Sep 13, 2012 12:13 PM      Regarding: Pt results       She did not have cancer, just precancerous lesions.  Looks like we got everything.  We will just need to keep a close eye on it in the future.            AT      ----- Message -----         From: [facility]         Sent: 09/13/2012  10:38 AM           To: Romie Levee, MD                   ------

## 2012-09-14 NOTE — Telephone Encounter (Signed)
I called again.  The home phone just rang and the cell phone was not able to allow voice messages.

## 2012-09-14 NOTE — Telephone Encounter (Signed)
I called the husband's cell and got him.  I told him the results.  He said the pt had a tough night with pain.  She got up this morning before he went to work and took some pain medicine.  She probably went back to work.  He will give her our phone # so she can call me back.

## 2012-09-23 ENCOUNTER — Encounter (INDEPENDENT_AMBULATORY_CARE_PROVIDER_SITE_OTHER): Payer: Self-pay

## 2012-09-28 ENCOUNTER — Encounter (INDEPENDENT_AMBULATORY_CARE_PROVIDER_SITE_OTHER): Payer: Self-pay | Admitting: General Surgery

## 2012-09-28 ENCOUNTER — Ambulatory Visit (INDEPENDENT_AMBULATORY_CARE_PROVIDER_SITE_OTHER): Payer: Medicare PPO | Admitting: General Surgery

## 2012-09-28 VITALS — BP 130/84 | HR 72 | Temp 97.6°F | Resp 12 | Ht 66.0 in | Wt 164.8 lb

## 2012-09-28 DIAGNOSIS — D013 Carcinoma in situ of anus and anal canal: Secondary | ICD-10-CM

## 2012-09-28 NOTE — Patient Instructions (Signed)
Return to the office in 3 months for recheck. 

## 2012-09-28 NOTE — Progress Notes (Signed)
Destiny Decker is a 67 y.o. female who is status post a hemorrhoidectomy and biopsy on 3/17.  She is doing well.  She still has a little pain with BM's, but this is getting better.    Objective: Filed Vitals:   09/28/12 0922  BP: 130/84  Pulse: 72  Temp: 97.6 F (36.4 C)  Resp: 12    General appearance: alert and cooperative GI: soft, non-tender; bowel sounds normal; no masses,  no organomegaly  Incision: healing well   Assessment: s/p  Patient Active Problem List  Diagnosis  . Squamous cell carcinoma of anal skin  . AIN grade III    Plan: Destiny Decker is a 67 y.o. F with AIN grade 2-3.  I will see her back in 3 months for a follow up exam and anoscopy.    [name], MD [facility], [place] [phone]   09/28/2012 9:54 AM

## 2012-12-23 ENCOUNTER — Ambulatory Visit (INDEPENDENT_AMBULATORY_CARE_PROVIDER_SITE_OTHER): Payer: Medicare PPO | Admitting: General Surgery

## 2012-12-26 ENCOUNTER — Ambulatory Visit (INDEPENDENT_AMBULATORY_CARE_PROVIDER_SITE_OTHER): Payer: Medicare PPO | Admitting: General Surgery

## 2012-12-26 ENCOUNTER — Other Ambulatory Visit (INDEPENDENT_AMBULATORY_CARE_PROVIDER_SITE_OTHER): Payer: Self-pay | Admitting: General Surgery

## 2012-12-26 VITALS — BP 118/70 | HR 74 | Resp 14 | Ht 66.0 in | Wt 164.8 lb

## 2012-12-26 DIAGNOSIS — K6282 Dysplasia of anus: Secondary | ICD-10-CM

## 2012-12-26 NOTE — Progress Notes (Signed)
Chief Complaint  Patient presents with  . Follow-up    reck rectum    HISTORY: Destiny Decker is a 67 y.o. female who presents to the office for f/u of her AIN.  This was found anal biopsy after hemorrhoidectomy.  She denies any new symptoms.  No bleeding or constipation  Past Medical History  Diagnosis Date  . Hypertension   . Diabetes mellitus without complication   . Hemorrhoids   . Hypothyroidism   . Anal squamous cell carcinoma   . Mild acid reflux   . Nocturia       Past Surgical History  Procedure Laterality Date  . Wisdom tooth extraction    . Left knee arthroscopy w/ debridement and removal ganglion cyst  05-17-2008  . Appendectomy  AGE 89  . Transanal excision of rectal mass N/A 09/12/2012    Procedure: TRANSANAL EXCISION OF RECTAL MASS;  Surgeon: Romie Levee, MD;  Location: [facility];  Service: General;  Laterality: N/A;      Current Outpatient Prescriptions  Medication Sig Dispense Refill  . amLODipine (NORVASC) 5 MG tablet Take 5 mg by mouth every morning.       . Aspirin 81 MG EC tablet Take 81 mg by mouth daily.      . calcium carbonate (TUMS - DOSED IN MG ELEMENTAL CALCIUM) 500 MG chewable tablet Chew 1 tablet by mouth daily.      . Cholecalciferol (VITAMIN D3) 1000 UNITS CAPS Take 1 capsule by mouth every morning.      . diazepam (VALIUM) 5 MG tablet Take 1 tablet (5 mg total) by mouth every 6 (six) hours as needed (rectal spasms, inability to urinate).  10 tablet  0  . diclofenac (VOLTAREN) 75 MG EC tablet Take 75 mg by mouth 2 (two) times daily.      Marland Kitchen docusate sodium (COLACE) 100 MG capsule Take 1 capsule (100 mg total) by mouth 3 (three) times daily.  60 capsule  2  . fish oil-omega-3 fatty acids 1000 MG capsule Take 1 g by mouth 2 (two) times daily.      Marland Kitchen ibuprofen (ADVIL,MOTRIN) 200 MG tablet Take 600 mg by mouth as needed for pain.      . Lancets (ONETOUCH ULTRASOFT) lancets       . levothyroxine (SYNTHROID, LEVOTHROID) 50 MCG tablet Take  50 mcg by mouth every morning.       . metFORMIN (GLUCOPHAGE) 500 MG tablet Take 500 mg by mouth 2 (two) times daily with a meal.      . ONE TOUCH ULTRA TEST test strip       . oxyCODONE (OXY IR/ROXICODONE) 5 MG immediate release tablet Take 1-2 tablets (5-10 mg total) by mouth every 4 (four) hours as needed for pain.  50 tablet  0  . Psyllium (DIETARY FIBER LAXATIVE) 28.3 % POWD Take 1 application by mouth 2 (two) times daily.       No current facility-administered medications for this visit.      Allergies  Allergen Reactions  . Penicillins Itching    In groin area only.      Family History  Problem Relation Age of Onset  . Diabetes Mother   . Cancer Sister     breast  . Hypertension Sister       History   Social History  . Marital Status: Married    Spouse Name: N/A    Number of Children: N/A  . Years of Education: N/A  Social History Main Topics  . Smoking status: Never Smoker   . Smokeless tobacco: Never Used  . Alcohol Use: No  . Drug Use: No  . Sexually Active: Not on file   Other Topics Concern  . Not on file   Social History Narrative  . No narrative on file       REVIEW OF SYSTEMS - PERTINENT POSITIVES ONLY: Review of Systems - General ROS: negative for - chills or fever Respiratory ROS: no cough, shortness of breath, or wheezing Cardiovascular ROS: no chest pain or dyspnea on exertion Gastrointestinal ROS: no abdominal pain, change in bowel habits, or black or bloody stools Genito-Urinary ROS: no dysuria, trouble voiding, or hematuria  EXAM: Filed Vitals:   12/26/12 1132  BP: 118/70  Pulse: 74  Resp: 14    Gen:  No acute distress.  Well nourished and well groomed.   Neurological: Alert and oriented to person, place, and time. Coordination normal.  Head: Normocephalic and atraumatic.  Eyes: Conjunctivae are normal. Pupils are equal, round, and reactive to light. No scleral icterus.  Neck: Normal range of motion. Neck supple. No tracheal  deviation or thyromegaly present.  No cervical lymphadenopathy. Cardiovascular: Normal rate, regular rhythm, normal heart sounds and intact distal pulses.  Exam reveals no gallop and no friction rub.  No murmur heard. Respiratory: Effort normal.  No respiratory distress. No chest wall tenderness. Breath sounds normal.  No wheezes, rales or rhonchi.  GI: Soft. Bowel sounds are normal. The abdomen is soft and nontender.  There is no rebound and no guarding.  Psychiatric: Normal mood and affect. Behavior is normal. Judgment and thought content normal.  Anorectal: deferred    ASSESSMENT AND PLAN: Destiny Decker is a 67 y.o. F with AIN 2-3 found on biopsy 3 months ago.  We will schedule her for HRA and ablation of any affected areas.  We discussed the risks of surgery, which are mainly pain, bleeding and recurrence.      [name], MD Colon and Rectal Surgery / General Surgery [facility]      Visit Diagnoses: 1. AIN grade II     Primary Care Physician: Benita Stabile, MD

## 2012-12-26 NOTE — Patient Instructions (Signed)
We will schedule a surgery to recheck your anal area for precancerous lesions.

## 2012-12-28 ENCOUNTER — Encounter (HOSPITAL_BASED_OUTPATIENT_CLINIC_OR_DEPARTMENT_OTHER): Payer: Self-pay | Admitting: *Deleted

## 2012-12-28 NOTE — Progress Notes (Addendum)
NPO AFTER MN WITH EXCEPTION WATER/ GATORADE UNTIL 0700. ARRIVE AT 1130. NEEDS ISTAT. CURRENT EKG IN EPIC AND CHART. WILL TAKE SYNTHROID AND NORVASC AM OF SURG W/ SIP OF WATER.

## 2013-01-04 ENCOUNTER — Encounter (HOSPITAL_BASED_OUTPATIENT_CLINIC_OR_DEPARTMENT_OTHER)
Admission: RE | Disposition: A | Discharge status: Home / Self Care | Payer: Self-pay | Source: Ambulatory Visit | Attending: General Surgery

## 2013-01-04 ENCOUNTER — Ambulatory Visit (HOSPITAL_BASED_OUTPATIENT_CLINIC_OR_DEPARTMENT_OTHER): Payer: Medicare PPO | Admitting: Anesthesiology

## 2013-01-04 ENCOUNTER — Encounter (HOSPITAL_BASED_OUTPATIENT_CLINIC_OR_DEPARTMENT_OTHER): Payer: Self-pay | Admitting: *Deleted

## 2013-01-04 ENCOUNTER — Ambulatory Visit (HOSPITAL_BASED_OUTPATIENT_CLINIC_OR_DEPARTMENT_OTHER)
Admission: RE | Admit: 2013-01-04 | Discharge: 2013-01-04 | Disposition: A | Discharge status: Home / Self Care | Payer: Medicare PPO | Source: Ambulatory Visit | Attending: General Surgery | Admitting: General Surgery

## 2013-01-04 ENCOUNTER — Encounter (HOSPITAL_BASED_OUTPATIENT_CLINIC_OR_DEPARTMENT_OTHER): Payer: Self-pay | Admitting: Anesthesiology

## 2013-01-04 DIAGNOSIS — D013 Carcinoma in situ of anus and anal canal: Secondary | ICD-10-CM

## 2013-01-04 DIAGNOSIS — K6282 Dysplasia of anus: Secondary | ICD-10-CM | POA: Insufficient documentation

## 2013-01-04 DIAGNOSIS — I10 Essential (primary) hypertension: Secondary | ICD-10-CM | POA: Insufficient documentation

## 2013-01-04 DIAGNOSIS — E039 Hypothyroidism, unspecified: Secondary | ICD-10-CM | POA: Insufficient documentation

## 2013-01-04 DIAGNOSIS — Z7982 Long term (current) use of aspirin: Secondary | ICD-10-CM | POA: Insufficient documentation

## 2013-01-04 DIAGNOSIS — E119 Type 2 diabetes mellitus without complications: Secondary | ICD-10-CM | POA: Insufficient documentation

## 2013-01-04 DIAGNOSIS — Z79899 Other long term (current) drug therapy: Secondary | ICD-10-CM | POA: Insufficient documentation

## 2013-01-04 HISTORY — DX: Type 2 diabetes mellitus without complications: E11.9

## 2013-01-04 HISTORY — DX: Other complications of anesthesia, initial encounter: T88.59XA

## 2013-01-04 HISTORY — PX: EXAMINATION UNDER ANESTHESIA: SHX1540

## 2013-01-04 HISTORY — DX: Adverse effect of unspecified anesthetic, initial encounter: T41.45XA

## 2013-01-04 LAB — POCT I-STAT 4, (NA,K, GLUC, HGB,HCT)
Glucose, Bld: 122 mg/dL — ABNORMAL HIGH (ref 70–99)
HCT: 42 % (ref 36.0–46.0)
Sodium: 142 mEq/L (ref 135–145)

## 2013-01-04 LAB — GLUCOSE, CAPILLARY: Glucose-Capillary: 120 mg/dL — ABNORMAL HIGH (ref 70–99)

## 2013-01-04 SURGERY — EXAM UNDER ANESTHESIA
Anesthesia: Monitor Anesthesia Care | Site: Anus | Wound class: Clean Contaminated

## 2013-01-04 MED ORDER — HYDROMORPHONE HCL PF 1 MG/ML IJ SOLN
0.2500 mg | INTRAMUSCULAR | Status: DC | PRN
  Filled 2013-01-04: qty 1

## 2013-01-04 MED ORDER — ONDANSETRON HCL 4 MG/2ML IJ SOLN
4.0000 mg | Freq: Four times a day (QID) | INTRAMUSCULAR | Status: DC | PRN
  Filled 2013-01-04: qty 2

## 2013-01-04 MED ORDER — SODIUM CHLORIDE 0.9 % IJ SOLN
3.0000 mL | INTRAMUSCULAR | Status: DC | PRN
  Filled 2013-01-04: qty 3

## 2013-01-04 MED ORDER — ACETIC ACID 5 % SOLN
Status: DC | PRN
  Administered 2013-01-04: 1 via TOPICAL

## 2013-01-04 MED ORDER — OXYCODONE HCL 5 MG PO TABS: 5.0000 mg | ORAL_TABLET | Freq: Four times a day (QID) | ORAL | Status: DC | PRN

## 2013-01-04 MED ORDER — ONDANSETRON HCL 4 MG/2ML IJ SOLN
INTRAMUSCULAR | Status: DC | PRN
  Administered 2013-01-04: 4 mg via INTRAVENOUS

## 2013-01-04 MED ORDER — SODIUM CHLORIDE 0.9 % IV SOLN
250.0000 mL | INTRAVENOUS | Status: DC | PRN
  Filled 2013-01-04: qty 250

## 2013-01-04 MED ORDER — LACTATED RINGERS IV SOLN
INTRAVENOUS | Status: DC
  Administered 2013-01-04: 15:00:00 via INTRAVENOUS
  Filled 2013-01-04: qty 1000

## 2013-01-04 MED ORDER — LIDOCAINE 5 % EX OINT
TOPICAL_OINTMENT | CUTANEOUS | Status: DC | PRN
  Administered 2013-01-04: 1

## 2013-01-04 MED ORDER — KETOROLAC TROMETHAMINE 30 MG/ML IJ SOLN
INTRAMUSCULAR | Status: DC | PRN
  Administered 2013-01-04: 30 mg via INTRAVENOUS

## 2013-01-04 MED ORDER — PROPOFOL 10 MG/ML IV EMUL
INTRAVENOUS | Status: DC | PRN
  Administered 2013-01-04: 50 ug/kg/min via INTRAVENOUS

## 2013-01-04 MED ORDER — ACETAMINOPHEN 650 MG RE SUPP
650.0000 mg | RECTAL | Status: DC | PRN
  Filled 2013-01-04: qty 1

## 2013-01-04 MED ORDER — ACETAMINOPHEN 325 MG PO TABS
650.0000 mg | ORAL_TABLET | ORAL | Status: DC | PRN
  Filled 2013-01-04: qty 2

## 2013-01-04 MED ORDER — FENTANYL CITRATE 0.05 MG/ML IJ SOLN
INTRAMUSCULAR | Status: DC | PRN
Start: 2013-01-04 — End: 2013-01-04
  Administered 2013-01-04 (×16): 12.5 ug via INTRAVENOUS

## 2013-01-04 MED ORDER — DEXAMETHASONE SODIUM PHOSPHATE 4 MG/ML IJ SOLN
INTRAMUSCULAR | Status: DC | PRN
  Administered 2013-01-04: 4 mg via INTRAVENOUS

## 2013-01-04 MED ORDER — PROMETHAZINE HCL 25 MG/ML IJ SOLN
6.2500 mg | INTRAMUSCULAR | Status: DC | PRN
  Filled 2013-01-04: qty 1

## 2013-01-04 MED ORDER — DOCUSATE SODIUM 100 MG PO CAPS: 100.0000 mg | ORAL_CAPSULE | Freq: Two times a day (BID) | ORAL | Status: AC

## 2013-01-04 MED ORDER — LACTATED RINGERS IV SOLN
INTRAVENOUS | Status: DC
  Administered 2013-01-04: 100 mL/h via INTRAVENOUS
  Filled 2013-01-04: qty 1000

## 2013-01-04 MED ORDER — BUPIVACAINE-EPINEPHRINE 0.25% -1:200000 IJ SOLN
INTRAMUSCULAR | Status: DC | PRN
  Administered 2013-01-04: 20 mL

## 2013-01-04 MED ORDER — LACTATED RINGERS IV SOLN
INTRAVENOUS | Status: DC | PRN
  Administered 2013-01-04: 12:00:00 via INTRAVENOUS

## 2013-01-04 MED ORDER — OXYCODONE HCL 5 MG PO TABS: 5.0000 mg | ORAL_TABLET | ORAL | Status: DC | PRN

## 2013-01-04 MED ORDER — OXYCODONE HCL 5 MG PO TABS
5.0000 mg | ORAL_TABLET | ORAL | Status: DC | PRN
  Filled 2013-01-04: qty 2

## 2013-01-04 MED ORDER — MIDAZOLAM HCL 5 MG/5ML IJ SOLN
INTRAMUSCULAR | Status: DC | PRN
  Administered 2013-01-04 (×2): 0.5 mg via INTRAVENOUS
  Administered 2013-01-04: 1 mg via INTRAVENOUS
  Administered 2013-01-04: 2 mg via INTRAVENOUS

## 2013-01-04 MED ORDER — SODIUM CHLORIDE 0.9 % IJ SOLN
3.0000 mL | Freq: Two times a day (BID) | INTRAMUSCULAR | Status: DC
  Filled 2013-01-04: qty 3

## 2013-01-04 SURGICAL SUPPLY — 52 items
BANDAGE GAUZE ELAST BULKY 4 IN (GAUZE/BANDAGES/DRESSINGS) ×4 IMPLANT
BENZOIN TINCTURE PRP APPL 2/3 (GAUZE/BANDAGES/DRESSINGS) ×4 IMPLANT
BLADE HEX COATED 2.75 (ELECTRODE) ×2 IMPLANT
BLADE SURG 10 STRL SS (BLADE) IMPLANT
BLADE SURG 15 STRL LF DISP TIS (BLADE) ×1 IMPLANT
BLADE SURG 15 STRL SS (BLADE) ×1
BRIEF STRETCH FOR OB PAD LRG (UNDERPADS AND DIAPERS) ×2 IMPLANT
CANISTER SUCTION 2500CC (MISCELLANEOUS) ×2 IMPLANT
CLOTH BEACON ORANGE TIMEOUT ST (SAFETY) ×2 IMPLANT
COVER MAYO STAND STRL (DRAPES) ×2 IMPLANT
COVER TABLE BACK 60X90 (DRAPES) ×2 IMPLANT
DECANTER SPIKE VIAL GLASS SM (MISCELLANEOUS) IMPLANT
DRAPE LG THREE QUARTER DISP (DRAPES) ×4 IMPLANT
DRAPE PED LAPAROTOMY (DRAPES) ×2 IMPLANT
DRAPE UNDERBUTTOCKS STRL (DRAPE) IMPLANT
DRSG PAD ABDOMINAL 8X10 ST (GAUZE/BANDAGES/DRESSINGS) IMPLANT
ELECT BLADE 6.5 .24CM SHAFT (ELECTRODE) IMPLANT
ELECT REM PT RETURN 9FT ADLT (ELECTROSURGICAL) ×2
ELECTRODE REM PT RTRN 9FT ADLT (ELECTROSURGICAL) ×1 IMPLANT
GAUZE SPONGE 4X4 12PLY STRL LF (GAUZE/BANDAGES/DRESSINGS) ×2 IMPLANT
GAUZE SPONGE 4X4 16PLY XRAY LF (GAUZE/BANDAGES/DRESSINGS) ×2 IMPLANT
GAUZE VASELINE 3X9 (GAUZE/BANDAGES/DRESSINGS) IMPLANT
GLOVE BIO SURGEON STRL SZ 6.5 (GLOVE) ×2 IMPLANT
GLOVE BIOGEL PI IND STRL 7.0 (GLOVE) ×1 IMPLANT
GLOVE BIOGEL PI INDICATOR 7.0 (GLOVE) ×1
GLOVE INDICATOR 7.0 STRL GRN (GLOVE) ×2 IMPLANT
GOWN PREVENTION PLUS LG XLONG (DISPOSABLE) ×2 IMPLANT
GOWN PREVENTION PLUS XLARGE (GOWN DISPOSABLE) IMPLANT
GOWN STRL REIN XL XLG (GOWN DISPOSABLE) ×4 IMPLANT
HEMOSTAT SURGICEL 2X14 (HEMOSTASIS) IMPLANT
LEGGING LITHOTOMY PAIR STRL (DRAPES) IMPLANT
NDL SAFETY ECLIPSE 18X1.5 (NEEDLE) ×1 IMPLANT
NEEDLE HYPO 18GX1.5 SHARP (NEEDLE) ×1
NEEDLE HYPO 22GX1.5 SAFETY (NEEDLE) ×2 IMPLANT
NEEDLE HYPO 25X1 1.5 SAFETY (NEEDLE) ×2 IMPLANT
NS IRRIG 500ML POUR BTL (IV SOLUTION) ×2 IMPLANT
PACK BASIN DAY SURGERY FS (CUSTOM PROCEDURE TRAY) ×2 IMPLANT
PENCIL BUTTON HOLSTER BLD 10FT (ELECTRODE) ×2 IMPLANT
SPONGE GAUZE 4X4 12PLY (GAUZE/BANDAGES/DRESSINGS) IMPLANT
SPONGE SURGIFOAM ABS GEL 12-7 (HEMOSTASIS) ×2 IMPLANT
SUT CHROMIC 2 0 SH (SUTURE) IMPLANT
SUT CHROMIC 3 0 SH 27 (SUTURE) ×2 IMPLANT
SUT MON AB 3-0 SH 27 (SUTURE)
SUT MON AB 3-0 SH27 (SUTURE) IMPLANT
SUT VIC AB 4-0 P-3 18XBRD (SUTURE) IMPLANT
SUT VIC AB 4-0 P3 18 (SUTURE)
SYR BULB IRRIGATION 50ML (SYRINGE) ×2 IMPLANT
SYR CONTROL 10ML LL (SYRINGE) ×2 IMPLANT
TOWEL OR 17X24 6PK STRL BLUE (TOWEL DISPOSABLE) ×4 IMPLANT
TRAY DSU PREP LF (CUSTOM PROCEDURE TRAY) ×2 IMPLANT
TUBE CONNECTING 12X1/4 (SUCTIONS) ×2 IMPLANT
YANKAUER SUCT BULB TIP NO VENT (SUCTIONS) ×2 IMPLANT

## 2013-01-04 NOTE — Transfer of Care (Signed)
Immediate Anesthesia Transfer of Care Note Immediate Anesthesia Transfer of Care Note  Patient: Destiny Decker  Procedure(s) Performed: Procedure(s) (LRB): EXAM UNDER ANESTHESIA (N/A) HIGH RESOLUTION ANOSCOPY (N/A)  Patient Location: PACU  Anesthesia Type: MAC  Level of Consciousness: awake, sedated, patient cooperative and responds to stimulation  Airway & Oxygen Therapy: Patient Spontanous Breathing and Patient connected to face mask oxygen  Post-op Assessment: Report given to PACU RN, Post -op Vital signs reviewed and stable and Patient moving all extremities  Post vital signs: Reviewed and stable  Complications: No apparent anesthesia complications

## 2013-01-04 NOTE — Interval H&P Note (Signed)
History and Physical Interval Note:  01/04/2013 12:32 PM  Destiny Decker  has presented today for surgery, with the diagnosis of AIN  The various methods of treatment have been discussed with the patient and family. After consideration of risks, benefits and other options for treatment, the patient has consented to  Procedure(s): EXAM UNDER ANESTHESIA (N/A) HIGH RESOLUTION ANOSCOPY (N/A) as a surgical intervention .  The patient's history has been reviewed, patient examined, no change in status, stable for surgery.  I have reviewed the patient's chart and labs.  Questions were answered to the patient's satisfaction.     [name], MD  Colorectal and General Surgery [facility]

## 2013-01-04 NOTE — Anesthesia Preprocedure Evaluation (Signed)
Anesthesia Evaluation  Patient identified by MRN, date of birth, ID band Patient awake    Reviewed: Allergy & Precautions, H&P , NPO status , Patient's Chart, lab work & pertinent test results  Airway Mallampati: II TM Distance: >3 FB Neck ROM: full    Dental no notable dental hx. (+) Dental Advisory Given and Missing All right lateral and side teeth are missing.  A few missing teeth on left side too.  Front OK:   Pulmonary neg pulmonary ROS,  breath sounds clear to auscultation  Pulmonary exam normal       Cardiovascular Exercise Tolerance: Good hypertension, Pt. on medications negative cardio ROS  Rhythm:regular Rate:Normal     Neuro/Psych negative neurological ROS  negative psych ROS   GI/Hepatic negative GI ROS, Neg liver ROS, GERD-  Controlled,  Endo/Other  negative endocrine ROSdiabetes, Well Controlled, Type 2, Oral Hypoglycemic AgentsHypothyroidism   Renal/GU negative Renal ROS  negative genitourinary   Musculoskeletal   Abdominal   Peds  Hematology negative hematology ROS (+)   Anesthesia Other Findings   Reproductive/Obstetrics negative OB ROS                           Anesthesia Physical Anesthesia Plan  ASA: II  Anesthesia Plan: MAC   Post-op Pain Management:    Induction: Intravenous  Airway Management Planned: Simple Face Mask  Additional Equipment:   Intra-op Plan:   Post-operative Plan:   Informed Consent: I have reviewed the patients History and Physical, chart, labs and discussed the procedure including the risks, benefits and alternatives for the proposed anesthesia with the patient or authorized representative who has indicated his/her understanding and acceptance.   Dental advisory given  Plan Discussed with: CRNA  Anesthesia Plan Comments:         Anesthesia Quick Evaluation

## 2013-01-04 NOTE — Op Note (Signed)
01/04/2013  1:14 PM  PATIENT:  Destiny Decker  67 y.o. female  Patient Care Team: Benita Stabile, MD as PCP - General (Family Medicine)  PRE-OPERATIVE DIAGNOSIS:  AIN II-III  POST-OPERATIVE DIAGNOSIS:  same  PROCEDURE:  Procedure(s): EXAM UNDER ANESTHESIA HIGH RESOLUTION ANOSCOPY ANAL BIOPSIES  SURGEON:  Surgeon(s): Romie Levee, MD  ASSISTANT: none   ANESTHESIA:   local and IV sedation  EBL: 5ml  Total I/O In: 700 [I.V.:700] Out: -   Delay start of Pharmacological VTE agent (>24hrs) due to surgical blood loss or risk of bleeding:  no  DRAINS: none   SPECIMEN:  Source of Specimen:  L post anal canal, L post dentate line, R lateral anal canal  DISPOSITION OF SPECIMEN:  PATHOLOGY  COUNTS:  YES  PLAN OF CARE: Discharge to home after PACU  PATIENT DISPOSITION:  PACU - hemodynamically stable.  INDICATION: this is a 67 year old female who underwent a resection of an anal mass and marked. This showed to be AIN grade 2 and 3.  She is here today for followup high-resolution endoscopy with possible biopsy.  OR FINDINGS: left posterior and right lateral lesion, sent to pathology for further evaluation. Remaining areas ablated  DESCRIPTION: The patient was identified in the preoperative holding area and taken to the OR where they were laid prone on the operating room table. MAC anesthesia was smoothly induced.  The patient was then prepped and draped in the usual sterile fashion. A surgical timeout was performed indicating the correct patient, procedure, positioning and preoperative antibioitics. SCDs were noted to be in place and functioning prior to the operation.   After this was completed, a sponge was soaked in 5% acetic acid was placed over the perianal region. This was allowed to soak for 2 minutes. The sponge was removed and the perianal region was evaluated. There were no lesions in the perianal skin.  The internal anal canal was evaluated via anoscopy with a  Hill-Ferguson anoscope.  There were multiple posterior lesions but none anteriorly.  A sample lesion in the left posterior anal canal, a left posterior dentate line and right lateral anal canal were sampled with Metzenbaum scissors and sent to pathology for further examination.  After this was completed, hemostasis was achieved with electrocautery and all of the biopsy sites were closed using a 3-0 chromic suture.  There were a couple remaining lesions that were fulgurated with electrocautery. The patient was then awakened from anesthesia and sent to the postanesthesia care unit in stable condition. All counts were correct operating room staff. I was present and personally performed the entire procedure.

## 2013-01-04 NOTE — H&P (View-Only) (Signed)
Chief Complaint  Patient presents with  . Follow-up    reck rectum    HISTORY: Destiny Decker is a 67 y.o. female who presents to the office for f/u of her AIN.  This was found anal biopsy after hemorrhoidectomy.  She denies any new symptoms.  No bleeding or constipation  Past Medical History  Diagnosis Date  . Hypertension   . Diabetes mellitus without complication   . Hemorrhoids   . Hypothyroidism   . Anal squamous cell carcinoma   . Mild acid reflux   . Nocturia       Past Surgical History  Procedure Laterality Date  . Wisdom tooth extraction    . Left knee arthroscopy w/ debridement and removal ganglion cyst  05-17-2008  . Appendectomy  AGE 1  . Transanal excision of rectal mass N/A 09/12/2012    Procedure: TRANSANAL EXCISION OF RECTAL MASS;  Surgeon: Barbra Miner, MD;  Location: [facility];  Service: General;  Laterality: N/A;      Current Outpatient Prescriptions  Medication Sig Dispense Refill  . amLODipine (NORVASC) 5 MG tablet Take 5 mg by mouth every morning.       . Aspirin 81 MG EC tablet Take 81 mg by mouth daily.      . calcium carbonate (TUMS - DOSED IN MG ELEMENTAL CALCIUM) 500 MG chewable tablet Chew 1 tablet by mouth daily.      . Cholecalciferol (VITAMIN D3) 1000 UNITS CAPS Take 1 capsule by mouth every morning.      . diazepam (VALIUM) 5 MG tablet Take 1 tablet (5 mg total) by mouth every 6 (six) hours as needed (rectal spasms, inability to urinate).  10 tablet  0  . diclofenac (VOLTAREN) 75 MG EC tablet Take 75 mg by mouth 2 (two) times daily.      . docusate sodium (COLACE) 100 MG capsule Take 1 capsule (100 mg total) by mouth 3 (three) times daily.  60 capsule  2  . fish oil-omega-3 fatty acids 1000 MG capsule Take 1 g by mouth 2 (two) times daily.      . ibuprofen (ADVIL,MOTRIN) 200 MG tablet Take 600 mg by mouth as needed for pain.      . Lancets (ONETOUCH ULTRASOFT) lancets       . levothyroxine (SYNTHROID, LEVOTHROID) 50 MCG tablet Take  50 mcg by mouth every morning.       . metFORMIN (GLUCOPHAGE) 500 MG tablet Take 500 mg by mouth 2 (two) times daily with a meal.      . ONE TOUCH ULTRA TEST test strip       . oxyCODONE (OXY IR/ROXICODONE) 5 MG immediate release tablet Take 1-2 tablets (5-10 mg total) by mouth every 4 (four) hours as needed for pain.  50 tablet  0  . Psyllium (DIETARY FIBER LAXATIVE) 28.3 % POWD Take 1 application by mouth 2 (two) times daily.       No current facility-administered medications for this visit.      Allergies  Allergen Reactions  . Penicillins Itching    In groin area only.      Family History  Problem Relation Age of Onset  . Diabetes Mother   . Cancer Sister     breast  . Hypertension Sister       History   Social History  . Marital Status: Married    Spouse Name: N/A    Number of Children: N/A  . Years of Education: N/A     Social History Main Topics  . Smoking status: Never Smoker   . Smokeless tobacco: Never Used  . Alcohol Use: No  . Drug Use: No  . Sexually Active: Not on file   Other Topics Concern  . Not on file   Social History Narrative  . No narrative on file       REVIEW OF SYSTEMS - PERTINENT POSITIVES ONLY: Review of Systems - General ROS: negative for - chills or fever Respiratory ROS: no cough, shortness of breath, or wheezing Cardiovascular ROS: no chest pain or dyspnea on exertion Gastrointestinal ROS: no abdominal pain, change in bowel habits, or black or bloody stools Genito-Urinary ROS: no dysuria, trouble voiding, or hematuria  EXAM: Filed Vitals:   12/26/12 1132  BP: 118/70  Pulse: 74  Resp: 14    Gen:  No acute distress.  Well nourished and well groomed.   Neurological: Alert and oriented to person, place, and time. Coordination normal.  Head: Normocephalic and atraumatic.  Eyes: Conjunctivae are normal. Pupils are equal, round, and reactive to light. No scleral icterus.  Neck: Normal range of motion. Neck supple. No tracheal  deviation or thyromegaly present.  No cervical lymphadenopathy. Cardiovascular: Normal rate, regular rhythm, normal heart sounds and intact distal pulses.  Exam reveals no gallop and no friction rub.  No murmur heard. Respiratory: Effort normal.  No respiratory distress. No chest wall tenderness. Breath sounds normal.  No wheezes, rales or rhonchi.  GI: Soft. Bowel sounds are normal. The abdomen is soft and nontender.  There is no rebound and no guarding.  Psychiatric: Normal mood and affect. Behavior is normal. Judgment and thought content normal.  Anorectal: deferred    ASSESSMENT AND PLAN: [name] is a 67 y.o. F with AIN 2-3 found on biopsy 3 months ago.  We will schedule her for HRA and ablation of any affected areas.  We discussed the risks of surgery, which are mainly pain, bleeding and recurrence.      Marcques Wrightsman C Rhegan Trunnell, MD Colon and Rectal Surgery / General Surgery [facility]      Visit Diagnoses: 1. AIN grade II     Primary Care Physician: MITCHELL,LEWIS DEAN, MD   

## 2013-01-04 NOTE — Anesthesia Procedure Notes (Signed)
Procedure Name: MAC Date/Time: 01/04/2013 12:42 PM Performed by: Jessica Priest Pre-anesthesia Checklist: Patient identified, Timeout performed, Emergency Drugs available, Suction available and Patient being monitored Patient Re-evaluated:Patient Re-evaluated prior to inductionOxygen Delivery Method: Simple face mask Preoxygenation: Pre-oxygenation with 100% oxygen Intubation Type: IV induction

## 2013-01-05 ENCOUNTER — Encounter (HOSPITAL_BASED_OUTPATIENT_CLINIC_OR_DEPARTMENT_OTHER): Payer: Self-pay | Admitting: General Surgery

## 2013-01-05 NOTE — Anesthesia Postprocedure Evaluation (Signed)
  Anesthesia Post-op Note  Patient: Destiny Decker  Procedure(s) Performed: Procedure(s) (LRB): EXAM UNDER ANESTHESIA (N/A) HIGH RESOLUTION ANOSCOPY WITH BIIOPSIES (N/A)  Patient Location: PACU  Anesthesia Type: MAC  Level of Consciousness: awake and alert   Airway and Oxygen Therapy: Patient Spontanous Breathing  Post-op Pain: mild  Post-op Assessment: Post-op Vital signs reviewed, Patient's Cardiovascular Status Stable, Respiratory Function Stable, Patent Airway and No signs of Nausea or vomiting  Last Vitals:  Filed Vitals:   01/04/13 1511  BP: 150/73  Pulse: 59  Temp: 36.2 C  Resp: 14    Post-op Vital Signs: stable   Complications: No apparent anesthesia complications

## 2013-01-06 ENCOUNTER — Telehealth (INDEPENDENT_AMBULATORY_CARE_PROVIDER_SITE_OTHER): Payer: Self-pay

## 2013-01-06 NOTE — Telephone Encounter (Signed)
Message copied by Ivory Broad on Fri Jan 06, 2013  4:25 PM ------      Message from: [place], Broadus John.      Created: Fri Jan 06, 2013  2:00 PM      Regarding: biopsy results       Her biopsies showed no signs of cancer, but she does have some areas of dysplasia that we should probably watch.  We will discuss how we should follow this at her follow up apt.            AT ------

## 2013-01-06 NOTE — Telephone Encounter (Signed)
I spoke to the pt and her husband.  I told them the results and that Dr Maisie Fus would discuss at her next visit how she wants to follow up.

## 2013-01-17 ENCOUNTER — Encounter (INDEPENDENT_AMBULATORY_CARE_PROVIDER_SITE_OTHER): Payer: Self-pay | Admitting: General Surgery

## 2013-01-17 ENCOUNTER — Ambulatory Visit (INDEPENDENT_AMBULATORY_CARE_PROVIDER_SITE_OTHER): Payer: Medicare PPO | Admitting: General Surgery

## 2013-01-17 VITALS — BP 148/76 | HR 64 | Temp 98.8°F | Resp 16 | Ht 66.0 in | Wt 162.2 lb

## 2013-01-17 DIAGNOSIS — K6282 Dysplasia of anus: Secondary | ICD-10-CM

## 2013-01-17 DIAGNOSIS — K6289 Other specified diseases of anus and rectum: Secondary | ICD-10-CM

## 2013-01-17 MED ORDER — LIDOCAINE 5 % EX OINT: TOPICAL_OINTMENT | CUTANEOUS | Status: DC | PRN

## 2013-01-17 NOTE — Progress Notes (Signed)
Destiny Decker is a 67 y.o. female who is status post an anal EUA and HRA with laser ablation.  She is having some tenderness on her right gluteus that hurts when she sits.  She is having some pain with BM's.   Objective: Filed Vitals:   01/17/13 0950  BP: 148/76  Pulse: 64  Temp: 98.8 F (37.1 C)  Resp: 16    General appearance: alert and cooperative R gluteal vague mass noted, no erythema of fluctuance- possibly muscle related or lipoma.  Incision: healing well, small thrombosed hemorrhoid in R posterior posistion   Assessment: s/p  Patient Active Problem List   Diagnosis Date Noted  . AIN grade III 09/28/2012  . Squamous cell carcinoma of anal skin 08/25/2012    Plan: Destiny Decker is doing ok after surgery.  I have asked her to start sitz baths and a stool softener for the thrombosed hemorrhoid.  She is going to use a heating pad and her remaining narcotics for the gluteal pain.  If this gets worse, she will call us, and I can get an [place] to evaluate further.  We discussed her pathology.  Basically, nothing has changed since her last exam.  I have recommended f/u in the office in 6 mo for perianal exam.  I will repeat her anoscopy in 1 year.  If we see anything abnormal, we will repeat her HRA and biopsies.  She is in agreement to this plan and voiced understanding.    [name], MD [facility], [place] [phone]   01/17/2013 10:23 AM

## 2013-01-17 NOTE — Patient Instructions (Signed)
HEMORRHOIDS    Did you know... Hemorrhoids are one of the most common ailments known.  More than half the population will develop hemorrhoids, usually after age 68.  Millions of Americans currently suffer from hemorrhoids.  The average person suffers in silence for a long period before seeking medical care.  Today's treatment methods make some types of hemorrhoid removal much less painful.  What are hemorrhoids? Often described as "varicose veins of the anus and rectum", hemorrhoids are enlarged, bulging blood vessels in and about the anus and lower rectum. There are two types of hemorrhoids: external and internal, which refer to their location.  External (outside) hemorrhoids develop near the anus and are covered by very sensitive skin. These are usually painless. However, if a blood clot (thrombosis) develops in an external hemorrhoid, it becomes a painful, hard lump. The external hemorrhoid may bleed if it ruptures. Internal (inside) hemorrhoids develop within the anus beneath the lining. Painless bleeding and protrusion during bowel movements are the most common symptom. However, an internal hemorrhoid can cause severe pain if it is completely "prolapsed" - protrudes from the anal opening and cannot be pushed back inside.   What causes hemorrhoids? An exact cause is unknown; however, the upright posture of humans alone forces a great deal of pressure on the rectal veins, which sometimes causes them to bulge. Other contributing factors include:  . Aging  . Chronic constipation or diarrhea  . Pregnancy  . Heredity  . Straining during bowel movements  . Faulty bowel function due to overuse of laxatives or enemas . Spending long periods of time (e.g., reading) on the toilet  Whatever the cause, the tissues supporting the vessels stretch. As a result, the vessels dilate; their walls become thin and bleed. If the stretching and pressure continue, the weakened vessels protrude.  What are the  symptoms? If you notice any of the following, you could have hemorrhoids:  . Bleeding during bowel movements  . Protrusion during bowel movements . Itching in the anal area  . Pain  . Sensitive lump(s)  How are hemorrhoids treated? Mild symptoms can be relieved frequently by increasing the amount of fiber (e.g., fruits, vegetables, breads and cereals) and fluids in the diet. Eliminating excessive straining reduces the pressure on hemorrhoids and helps prevent them from protruding. A sitz bath - sitting in plain warm water for about 10 minutes - can also provide some relief . With these measures, the pain and swelling of most symptomatic hemorrhoids will decrease in two to seven days, and the firm lump should recede within four to six weeks. In cases of severe or persistent pain from a thrombosed hemorrhoid, your physician may elect to remove the hemorrhoid containing the clot with a small incision. Performed under local anesthesia as an outpatient, this procedure generally provides relief. Severe hemorrhoids may require special treatment, much of which can be performed on an outpatient basis.   Anal Dysplasia Expanded  What is anal dysplasia? Some warts have abnormal changes seen by the pathologist when they look at the removed wart under the microscope. These changes are called anal dysplasia and can be graded as to how advanced their dysplasia or abnormal changes are under the microscope. These changes are referred to by physicians as low-grade and high-grade anal intraepithelial neoplasia (LGAIN/HGAIN). Cells that are becoming malignant or "premalignant", but have not invaded deeper into the skin, are often referred to as HGAIN. While this condition is likely a precursor to anal cancer, this is not anal  cancer and is treated differently than anal cancer. Anal dysplasia (LGAIN/HGAIN) is similar to cervical dysplasia (cervical intraepithelial neoplasia or CIN) in that it originates from a HPV  infection and can develop into anal and cervical cancer, respectively. Thus, patients with anal dysplasia need close follow up determined by their physician and any new lesions must be evaluated promptly. A gynecologic examination is also recommended in females, as the presence of HGAIN puts a female patient at risk for having CIN. Who is at increased risk for anal dysplasia? Risk factors for anal dysplasia include: 1. Patients with HPV infections (most common) 2. Patients with history of anal intercourse 3. Positive HIV test 4. Cigarette smoking  5. Patients with a weakened immune system from certain medications (solid organ transplantation, RA, IBD) Why do we need to treat anal dysplasia? Once you have anal dysplasia, it rarely disappears. Although still exceedingly uncommon, there is a slight increase risk of anal cancer in patients with a history of anal dysplasia (less than 5%). Its progression in HIV-positive patients seems to be higher. The risk for progression to anal cancer may be as high as 10-50% among HIV-positive patients. How is anal dysplasia diagnosed? Anal dysplasia can be found in anal warts or sometimes these changes are found incidentally at the time of unrelated anal surgery (i.e. hemorrhoid surgery).  Screening procedures available to detect anal dysplasia include anal cytology and high-resolution anoscopy (HRA). However, they are not universally performed and their role in the management of patients with anal dysplasia is unknown at this time. 1. Anal Papanicolaou (Pap) smear cytology consists of using anal swabs to sample cells from the anal canal and can be used for both screening patients considered high-risk (see list above) and as follow up after anal dysplasia has been treated. Unfortunately, up to 45% of patients can have a false-positive test by anal PAP for anal dysplasia. As well, it is not known if anal PAP improves your outcome or decreases your risk of anal cancer.    2. HRA typically involves the application of temporary stains (3% acetic acid and Lugol's iodine solution) to the anal canal followed by evaluation under high-resolution microscopy to help differentiate normal from abnormal tissue. This is very similar to colposcopy (examination of the cervix) in women who have cervical dysplasia. Directed biopsies are performed for any questionable areas and to identify areas that may need further treatment. How is anal dysplasia treated? 1. Observation alone with close clinical follow-up may be considered for the treatment of anal dysplasia. There has been much debate regarding the best treatment for anal dysplasia. There is a high risk of recurrence following treatments so physicians may recommend close observation and physical examination every 3-6 months depending on your risk factors and if the screening procedures discussed are available in your area. However, due to the high risk of also having cervical dysplasia, a referral to gynecology is recommended. 2. Topical 5% imiquimod (Aldara) cream or 5% 5-fluorouracil (5-FU) cream may be applied to areas of anal dysplasia. Local treatment creams may be needed for 9-16 weeks. Up to 90% may have anal lesions disappear, although as many as 50% can recur. Local side effects are very common, occurring in up to 85%, and include skin irritation and hypo-pigmentation (loss of color around the anus), but these stop or reverse after you stop the cream. 3. Photodynamic therapy may be used in select patients. Similar to use in other types of skin cancers, photodynamic therapy has been described in patients with  anal dysplasia since 1992. In this process, photosensitizing agents such as 5-aminolevulinic acid creams are applied to the affected area followed by treatment with a specific wavelength laser. Studies have been limited and its role in patients with anal dysplasia is still unknown. 4. Targeted destruction and close clinical  long-term follow-up. A variety of surgical techniques to remove anal dysplasia have been used to prevent disease progression. These include wide local excision and targeted therapy using high-resolution anoscopy (HRA). Wide local excision's goal is to remove all of the affected areas with normal surrounding tissue ("negative margins"), although total removal of all disease is often difficult. Sometimes a local flap of normal tissue adjacent to the removed area is used to cover the large defect. There is still a high rate of recurrence of anal dysplasia despite a wide removal of tissue and high rates of complications such as stenosis anal (narrowing of the area) and fecal incontinence. Targeted destruction guided by high-resolution anoscopy is effective to identify, biopsy and destroy anal dysplasia without the long recovery and complications associated with wide local excision. However, there is still a high risk of persistent or recurrent disease, reported in up to 20-80%. Surgical complications such as incontinence and stenosis are generally not seen with HRA, though. Fortunately, both wide local excision and targeted destruction by HRA have been shown to prevent progression from anal dysplasia to anal cancer. How should I be followed after treatment for anal dysplasia? Patients with anal dysplasia should usually be closely followed long term to prevent or detect recurrence, persistence or progression to anal cancer. Physical examinations may be performed at 3 to 25-month intervals. This approach allows for the treatment of recurrent or persistent dysplasia or the detection of anal cancer. Follow-up generally includes digital rectal examination, anoscopic examination, with or without the aid of magnification or the application of acetic acid and Lugol's solution, and can be performed in an office setting. Anorectal cytology (anal PAP) and/or biopsy may also be included if available in your area. The importance of  close follow-up cannot be over emphasized in patients with history of anal dysplasia especially if new lesions develop. What is a colon and rectal surgeon? Colon and rectal surgeons are experts in the surgical and non-surgical treatment of diseases of the colon, rectum and anus. They have completed advanced surgical training in the treatment of these diseases as well as full general surgical training. Board-certified colon and rectal surgeons complete residencies in general surgery and colon and rectal surgery, and pass intensive examinations conducted by the American Board of Surgery and the American Board of Colon and Rectal Surgery. They are well-versed in the treatment of both benign and malignant diseases of the colon, rectum and anus and are able to perform routine screening examinations and surgically treat conditions if indicated to do so. Disclaimer The [employer] is dedicated to ensuring high-quality patient care by advancing the science, prevention, and management of disorders and diseases of the colon, rectum, and anus. These brochures are inclusive, and not prescriptive. Their purpose is to provide information on diseases processes, rather than dictate a specific form of treatment. They are intended for the use of all practitioners, health care workers, and patients who desire information about the management of the conditions addressed by the topics covered in these brochures. It should be recognized that these brochures should not be deemed inclusive of all proper methods of care or exclusive of methods of care reasonably directed to obtaining the same  results. The ultimate judgment regarding the propriety of any specific procedure must be made by the physician in light of all the circumstances presented by the individual patient.  author: Sherlean Foot, MD, FASCRS, on behalf of the ASCR[employer] Public Relations Committee   [employer] Society of Colon & Rectal  Surgeons

## 2013-01-18 ENCOUNTER — Telehealth (INDEPENDENT_AMBULATORY_CARE_PROVIDER_SITE_OTHER): Payer: Self-pay

## 2013-01-18 NOTE — Telephone Encounter (Signed)
Pt calling in b/c she is very worried about the pathology report that was given to her yesterday. The pt did not understand everything that was being told to her b/c her English is not too good. The pt has a friend that would tranlate for her in [place]. I advise her that Dr Maisie Fus is not in the office today but she will be here all day on Monday. The pt wants to speak to Dr Maisie Fus so the pt will be with the translator on Monday at 3:30 if Dr Maisie Fus would please call her Monday.

## 2013-02-06 ENCOUNTER — Other Ambulatory Visit (HOSPITAL_COMMUNITY): Payer: Self-pay | Admitting: Family Medicine

## 2013-02-06 DIAGNOSIS — Z1231 Encounter for screening mammogram for malignant neoplasm of breast: Secondary | ICD-10-CM

## 2013-02-16 ENCOUNTER — Other Ambulatory Visit (HOSPITAL_COMMUNITY): Payer: Self-pay | Admitting: Family Medicine

## 2013-02-16 ENCOUNTER — Ambulatory Visit (HOSPITAL_COMMUNITY)
Admission: RE | Admit: 2013-02-16 | Discharge: 2013-02-16 | Disposition: A | Discharge status: Home / Self Care | Payer: Medicare PPO | Source: Ambulatory Visit | Attending: Family Medicine | Admitting: Family Medicine

## 2013-02-16 DIAGNOSIS — Z1231 Encounter for screening mammogram for malignant neoplasm of breast: Secondary | ICD-10-CM | POA: Insufficient documentation

## 2014-01-16 ENCOUNTER — Encounter (INDEPENDENT_AMBULATORY_CARE_PROVIDER_SITE_OTHER): Payer: Self-pay | Admitting: General Surgery

## 2014-01-22 ENCOUNTER — Other Ambulatory Visit (HOSPITAL_COMMUNITY): Payer: Self-pay | Admitting: Family Medicine

## 2014-01-22 DIAGNOSIS — Z1231 Encounter for screening mammogram for malignant neoplasm of breast: Secondary | ICD-10-CM

## 2014-02-06 ENCOUNTER — Telehealth (INDEPENDENT_AMBULATORY_CARE_PROVIDER_SITE_OTHER): Payer: Self-pay

## 2014-02-06 ENCOUNTER — Ambulatory Visit (INDEPENDENT_AMBULATORY_CARE_PROVIDER_SITE_OTHER): Payer: Medicare PPO | Admitting: General Surgery

## 2014-02-06 VITALS — BP 150/72 | HR 68 | Temp 97.6°F | Ht 66.0 in | Wt 156.0 lb

## 2014-02-06 DIAGNOSIS — D013 Carcinoma in situ of anus and anal canal: Secondary | ICD-10-CM

## 2014-02-06 NOTE — Progress Notes (Signed)
Destiny Decker is a 68 y.o. female who is status post an anal EUA and HRA with laser ablation ~1 yr ago (7/14). Biopsies showed AIN grade 3.  She is still having some trouble with constipation.  Stool softeners weren't enough.  She denies any bleeding or anal masses.    Objective:  BP 150/72  Pulse 68  Temp(Src) 97.6 F (36.4 C)  Ht 5\' 6"  (1.676 m)  Wt 156 lb (70.761 kg)  BMI 25.19 kg/m2 General appearance: alert and cooperative  Abd: soft  Procedure: Anoscopy Surgeon: Marcello Moores Assistant: Alyse Low After the risks and benefits were explained, verbal consent was obtained for above procedure  Anesthesia: none Diagnosis: History of AIN grade 3 Findings: Small patch of plaque like formation in the left lateral internal anal canal. This is stable and most likely scar from HRA.  Assessment:  s/p  Patient Active Problem List    Diagnosis  Date Noted   .  AIN grade III  09/28/2012   .  Squamous cell carcinoma of anal skin  08/25/2012   Plan:  Destiny Decker is A 68 year old female with a history of AIN grade 3 found on biopsy approximately one year ago. She is here today for a repeat exam. Her anoscopy findings are stable. I do not see anything that would require a biopsy. We discussed returning to the operating room for repeat high-resolution anoscopy versus watching in the office on a yearly basis. She has elected to undergo outpatient surveillance for right now.  I will see her back in 1 year.

## 2014-02-06 NOTE — Telephone Encounter (Signed)
Called and left message for patient to call our office RE see if patient would like to come in now for appointment vs. 12:15pm.

## 2014-02-06 NOTE — Patient Instructions (Signed)

## 2014-02-19 ENCOUNTER — Ambulatory Visit (HOSPITAL_COMMUNITY)
Admission: RE | Admit: 2014-02-19 | Discharge: 2014-02-19 | Disposition: A | Discharge status: Home / Self Care | Payer: Medicare PPO | Source: Ambulatory Visit | Attending: Family Medicine | Admitting: Family Medicine

## 2014-02-19 DIAGNOSIS — Z1231 Encounter for screening mammogram for malignant neoplasm of breast: Secondary | ICD-10-CM | POA: Insufficient documentation

## 2014-11-30 ENCOUNTER — Other Ambulatory Visit (HOSPITAL_COMMUNITY): Payer: Self-pay | Admitting: Family Medicine

## 2014-11-30 DIAGNOSIS — Z1231 Encounter for screening mammogram for malignant neoplasm of breast: Secondary | ICD-10-CM

## 2015-01-22 DIAGNOSIS — M19041 Primary osteoarthritis, right hand: Secondary | ICD-10-CM | POA: Diagnosis not present

## 2015-01-30 ENCOUNTER — Ambulatory Visit (INDEPENDENT_AMBULATORY_CARE_PROVIDER_SITE_OTHER): Payer: Medicare PPO | Admitting: Podiatry

## 2015-01-30 ENCOUNTER — Telehealth: Payer: Self-pay | Admitting: *Deleted

## 2015-01-30 ENCOUNTER — Ambulatory Visit (INDEPENDENT_AMBULATORY_CARE_PROVIDER_SITE_OTHER): Payer: Medicare PPO

## 2015-01-30 ENCOUNTER — Encounter: Payer: Self-pay | Admitting: Podiatry

## 2015-01-30 VITALS — BP 163/79 | HR 60 | Temp 97.2°F | Resp 12

## 2015-01-30 DIAGNOSIS — R0989 Other specified symptoms and signs involving the circulatory and respiratory systems: Secondary | ICD-10-CM | POA: Diagnosis not present

## 2015-01-30 DIAGNOSIS — S90221A Contusion of right lesser toe(s) with damage to nail, initial encounter: Secondary | ICD-10-CM | POA: Diagnosis not present

## 2015-01-30 DIAGNOSIS — R52 Pain, unspecified: Secondary | ICD-10-CM

## 2015-01-30 DIAGNOSIS — L03031 Cellulitis of right toe: Secondary | ICD-10-CM | POA: Diagnosis not present

## 2015-01-30 DIAGNOSIS — L03011 Cellulitis of right finger: Secondary | ICD-10-CM

## 2015-01-30 MED ORDER — CLINDAMYCIN HCL 150 MG PO CAPS: 150.0000 mg | ORAL_CAPSULE | Freq: Four times a day (QID) | ORAL | Status: DC

## 2015-01-30 NOTE — Patient Instructions (Signed)
Begin taking care antibiotic by mouth one capsule  times 4 times a day 7 days Our office will contact the vascular lab and arrange his circulation test  Betadine Soak Instructions  Purchase an 8 oz. bottle of BETADINE solution (Povidone)  THE DAY AFTER THE PROCEDURE  Place 1 tablespoon of betadine solution in a quart of warm tap water.  Submerge your foot or feet with outer bandage intact for the initial soak; this will allow the bandage to become moist and wet for easy lift off.  Once you remove your bandage, continue to soak in the solution for 5 minutes.  This soak should be done twice a day.  Next, remove your foot or feet from solution, blot dry the affected area and cover.  You may use a band aid large enough to cover the area or use gauze and tape.  Apply other medications to the area as directed by the doctor such as   Neosporin.triple  Or antibiotic oitment  IF YOUR SKIN BECOMES IRRITATED WHILE USING THESE INSTRUCTIONS, IT IS OKAY TO SWITCH TO EPSOM SALTS AND WATER OR WHITE VINEGAR AND WATER.

## 2015-01-30 NOTE — Telephone Encounter (Signed)
Faxed Dr. Andee Poles for B/L lower arterial dopplers and ABI and TBI - diabetic pt with decreased pedal pulses.

## 2015-01-30 NOTE — Progress Notes (Signed)
   Subjective:    Patient ID: Destiny Decker, female    DOB: [birth year], 69 y.o.   MRN: [id number]  HPI   This patient presents today complaining of a bleeding right hallux toenail area after striking the right great toe against a table approximately 2 months ago, however, with increasing pressure bleeding in the past 1 month. She describes some pressure-like sensation in the area when she wears a close shoe. She is attempted to trim the nail and apply topical antibiotic ointment, Neosporin and redness with peroxide.  Review of Systems  Eyes: Positive for visual disturbance.  Skin: Positive for color change.  Hematological: Bruises/bleeds easily.  All other systems reviewed and are negative.  Patient is a diabetic probable type 22 years. She denies any history of foot ulceration, claudication or amputation      Objective:   Physical Exam  Orientated 3  Vascular: No peripheral edema noted bilaterally DP pulse 2/4 right and 0/4 left PT pulses 0/4 bilaterally Capillary reflex immediate bilaterally  Neurological: Sensation to 10 g monofilament wire intact 3/5 right and 1/5 left Vibratory sensation nonreactive bilaterally Ankle reflexes reactive bilaterally  Dermatological: There is a low-grade erythema and edema in the right hallux The right hallux nail plate is lifting off the nailbed with some residual bloody drainage and crusting in the area.  Musculoskeletal: Hammertoe deformities 2-5 bilaterally    X-ray examination weightbearing right foot History of trauma to the right hallux  Intact bony structure without fracture and/or dislocation There is no cortical disruption in the right hallux area including the proximal and distal phalanx There is no emphysema noted in the right hallux  Radiographic impression: No acute bony abnormality noted in the right foot No x-ray sign of osteomyelitis in the right hallux or fracture          Assessment & Plan:    Assessment: Decrease pedal pulses bilaterally that may be suggestive peripheral arterial disease Diabetic peripheral neuropathy Subungual hematoma right hallux Cellulitis right hallux  Plan: Review the results of x-ray and physical examination today. I made patient aware that she had decrease pedal pulses that needed further evaluation and I would refer to a vascular lab for a lower extremity arterial Doppler with TBI's for the indication of decreased pedal pulses, diabetes and cellulitis  The right hallux nail plate was easily debrided to ythe posterior nail fold area releasing a bloody serous drainage and crusting in the nailbed. The underlying nailbed appears to be intact. There was slight malodor noted.  Rx clindamycin 150 mg by mouth 4 times a day 7 days Rx diluted Betadine soaks daily with application of topical antibiotic ointment and a gauze dressing  Patient referred to vascular lab will notify patient of vascular results. She will contact us if the symptoms have not responded to the I&D of the subungual hematoma and a week of oral antibiotics

## 2015-02-07 ENCOUNTER — Other Ambulatory Visit: Payer: Self-pay | Admitting: Podiatry

## 2015-02-07 ENCOUNTER — Ambulatory Visit (HOSPITAL_COMMUNITY)
Admission: RE | Admit: 2015-02-07 | Discharge: 2015-02-07 | Disposition: A | Discharge status: Home / Self Care | Payer: Medicare PPO | Source: Ambulatory Visit | Attending: Cardiology | Admitting: Cardiology

## 2015-02-07 ENCOUNTER — Ambulatory Visit (HOSPITAL_COMMUNITY)
Admission: RE | Admit: 2015-02-07 | Discharge: 2015-02-07 | Disposition: A | Discharge status: Home / Self Care | Payer: Medicare PPO | Source: Ambulatory Visit | Attending: Podiatry | Admitting: Podiatry

## 2015-02-07 DIAGNOSIS — R0989 Other specified symptoms and signs involving the circulatory and respiratory systems: Secondary | ICD-10-CM

## 2015-02-07 DIAGNOSIS — I739 Peripheral vascular disease, unspecified: Secondary | ICD-10-CM

## 2015-02-08 ENCOUNTER — Ambulatory Visit (HOSPITAL_COMMUNITY)
Admission: RE | Admit: 2015-02-08 | Payer: Medicare PPO | Source: Ambulatory Visit | Attending: Podiatry | Admitting: Podiatry

## 2015-02-08 ENCOUNTER — Telehealth: Payer: Self-pay | Admitting: *Deleted

## 2015-02-08 NOTE — Telephone Encounter (Signed)
Patient had lower arterial doppler completed 02/07/15---there was an issue with the order from Dr. Antony Haste, Vascular Services Manager "cancelled"  the check in for this patient.  Order was corrected, but could not "arrive" patient for the visit on 02/07/15---signatures are still present for the test.

## 2015-02-14 ENCOUNTER — Telehealth: Payer: Self-pay | Admitting: *Deleted

## 2015-02-14 DIAGNOSIS — M150 Primary generalized (osteo)arthritis: Secondary | ICD-10-CM | POA: Diagnosis not present

## 2015-02-14 DIAGNOSIS — E039 Hypothyroidism, unspecified: Secondary | ICD-10-CM | POA: Diagnosis not present

## 2015-02-14 DIAGNOSIS — E782 Mixed hyperlipidemia: Secondary | ICD-10-CM | POA: Diagnosis not present

## 2015-02-14 DIAGNOSIS — I10 Essential (primary) hypertension: Secondary | ICD-10-CM | POA: Diagnosis not present

## 2015-02-14 DIAGNOSIS — R05 Cough: Secondary | ICD-10-CM | POA: Diagnosis not present

## 2015-02-14 DIAGNOSIS — E119 Type 2 diabetes mellitus without complications: Secondary | ICD-10-CM | POA: Diagnosis not present

## 2015-02-14 NOTE — Telephone Encounter (Addendum)
-----   Message from Gean Birchwood, DPM sent at 02/14/2015 11:11 AM EDT ----- Lower extremity arterial Doppler examination dated 11 2016 The bilateral ABIs are in the normal range. They may be falsely elevated secondary to medial calcification Normal great toe pressures bilaterally All 10 toe PPG waveforms are abnormal with good amplitude  Vascular examination seems to be adequate knees no obvious follow-up at this time notify patient no follow-up needed.  I informed pt of Dr. Phoebe Perch review of the dopplers.  Pt request the our office notes and doppler results to be sent to Dr. Maurine Cane.

## 2015-03-07 ENCOUNTER — Ambulatory Visit (HOSPITAL_COMMUNITY)
Admission: RE | Admit: 2015-03-07 | Discharge: 2015-03-07 | Disposition: A | Discharge status: Home / Self Care | Payer: Medicare PPO | Source: Ambulatory Visit | Attending: Family Medicine | Admitting: Family Medicine

## 2015-03-07 DIAGNOSIS — Z1231 Encounter for screening mammogram for malignant neoplasm of breast: Secondary | ICD-10-CM | POA: Insufficient documentation

## 2015-04-17 DIAGNOSIS — Z23 Encounter for immunization: Secondary | ICD-10-CM | POA: Diagnosis not present

## 2015-04-18 DIAGNOSIS — D013 Carcinoma in situ of anus and anal canal: Secondary | ICD-10-CM | POA: Diagnosis not present

## 2016-02-07 ENCOUNTER — Other Ambulatory Visit: Payer: Self-pay | Admitting: Family Medicine

## 2016-02-07 DIAGNOSIS — Z1231 Encounter for screening mammogram for malignant neoplasm of breast: Secondary | ICD-10-CM

## 2016-03-11 ENCOUNTER — Inpatient Hospital Stay: Admission: RE | Admit: 2016-03-11 | Payer: Medicare PPO | Source: Ambulatory Visit

## 2016-03-26 ENCOUNTER — Ambulatory Visit
Admission: RE | Admit: 2016-03-26 | Discharge: 2016-03-26 | Disposition: A | Discharge status: Home / Self Care | Payer: Medicare Other | Source: Ambulatory Visit | Attending: Family Medicine | Admitting: Family Medicine

## 2016-03-26 DIAGNOSIS — Z1231 Encounter for screening mammogram for malignant neoplasm of breast: Secondary | ICD-10-CM

## 2017-03-11 ENCOUNTER — Other Ambulatory Visit: Payer: Self-pay

## 2017-03-11 DIAGNOSIS — Z1231 Encounter for screening mammogram for malignant neoplasm of breast: Secondary | ICD-10-CM

## 2017-03-16 ENCOUNTER — Ambulatory Visit
Admission: RE | Admit: 2017-03-16 | Discharge: 2017-03-16 | Disposition: A | Discharge status: Home / Self Care | Payer: Medicare Other | Source: Ambulatory Visit | Attending: Family Medicine | Admitting: Family Medicine

## 2017-03-16 DIAGNOSIS — Z1231 Encounter for screening mammogram for malignant neoplasm of breast: Secondary | ICD-10-CM

## 2017-05-31 ENCOUNTER — Other Ambulatory Visit: Payer: Self-pay | Admitting: Family Medicine

## 2017-05-31 ENCOUNTER — Ambulatory Visit
Admission: RE | Admit: 2017-05-31 | Discharge: 2017-05-31 | Disposition: A | Discharge status: Home / Self Care | Payer: Medicare Other | Source: Ambulatory Visit | Attending: Family Medicine | Admitting: Family Medicine

## 2017-05-31 DIAGNOSIS — M545 Low back pain: Secondary | ICD-10-CM

## 2018-02-08 ENCOUNTER — Other Ambulatory Visit: Payer: Self-pay | Admitting: Family Medicine

## 2018-02-08 DIAGNOSIS — Z1231 Encounter for screening mammogram for malignant neoplasm of breast: Secondary | ICD-10-CM

## 2018-03-18 ENCOUNTER — Ambulatory Visit: Payer: Medicare Other

## 2018-03-22 ENCOUNTER — Ambulatory Visit
Admission: RE | Admit: 2018-03-22 | Discharge: 2018-03-22 | Disposition: A | Discharge status: Home / Self Care | Payer: Medicare Other | Source: Ambulatory Visit | Attending: Family Medicine | Admitting: Family Medicine

## 2018-03-22 DIAGNOSIS — Z1231 Encounter for screening mammogram for malignant neoplasm of breast: Secondary | ICD-10-CM

## 2018-04-01 ENCOUNTER — Other Ambulatory Visit: Payer: Self-pay | Admitting: Family Medicine

## 2018-04-01 DIAGNOSIS — H34232 Retinal artery branch occlusion, left eye: Secondary | ICD-10-CM

## 2018-04-05 ENCOUNTER — Other Ambulatory Visit: Payer: Medicare Other

## 2018-04-13 ENCOUNTER — Ambulatory Visit
Admission: RE | Admit: 2018-04-13 | Discharge: 2018-04-13 | Disposition: A | Discharge status: Home / Self Care | Payer: Medicare Other | Source: Ambulatory Visit | Attending: Family Medicine | Admitting: Family Medicine

## 2018-04-13 DIAGNOSIS — H34232 Retinal artery branch occlusion, left eye: Secondary | ICD-10-CM

## 2018-12-30 ENCOUNTER — Encounter (HOSPITAL_BASED_OUTPATIENT_CLINIC_OR_DEPARTMENT_OTHER): Payer: Self-pay | Admitting: Emergency Medicine

## 2018-12-30 ENCOUNTER — Emergency Department (HOSPITAL_BASED_OUTPATIENT_CLINIC_OR_DEPARTMENT_OTHER)
Admission: EM | Admit: 2018-12-30 | Discharge: 2018-12-30 | Disposition: A | Discharge status: Home / Self Care | Payer: Medicare Other | Attending: Emergency Medicine | Admitting: Emergency Medicine

## 2018-12-30 ENCOUNTER — Other Ambulatory Visit: Payer: Self-pay

## 2018-12-30 DIAGNOSIS — Z7982 Long term (current) use of aspirin: Secondary | ICD-10-CM | POA: Insufficient documentation

## 2018-12-30 DIAGNOSIS — E039 Hypothyroidism, unspecified: Secondary | ICD-10-CM | POA: Diagnosis not present

## 2018-12-30 DIAGNOSIS — Z79899 Other long term (current) drug therapy: Secondary | ICD-10-CM | POA: Insufficient documentation

## 2018-12-30 DIAGNOSIS — E119 Type 2 diabetes mellitus without complications: Secondary | ICD-10-CM | POA: Insufficient documentation

## 2018-12-30 DIAGNOSIS — R531 Weakness: Secondary | ICD-10-CM | POA: Insufficient documentation

## 2018-12-30 DIAGNOSIS — Z7984 Long term (current) use of oral hypoglycemic drugs: Secondary | ICD-10-CM | POA: Insufficient documentation

## 2018-12-30 DIAGNOSIS — I10 Essential (primary) hypertension: Secondary | ICD-10-CM | POA: Diagnosis present

## 2018-12-30 DIAGNOSIS — Z85828 Personal history of other malignant neoplasm of skin: Secondary | ICD-10-CM | POA: Diagnosis not present

## 2018-12-30 LAB — CBC WITH DIFFERENTIAL/PLATELET
Abs Immature Granulocytes: 0.05 10*3/uL (ref 0.00–0.07)
Basophils Absolute: 0 10*3/uL (ref 0.0–0.1)
Basophils Relative: 1 %
Eosinophils Absolute: 0.2 10*3/uL (ref 0.0–0.5)
Eosinophils Relative: 2 %
HCT: 42.2 % (ref 36.0–46.0)
Hemoglobin: 14.1 g/dL (ref 12.0–15.0)
Immature Granulocytes: 1 %
Lymphocytes Relative: 20 %
Lymphs Abs: 1.6 10*3/uL (ref 0.7–4.0)
MCH: 29.1 pg (ref 26.0–34.0)
MCHC: 33.4 g/dL (ref 30.0–36.0)
MCV: 87 fL (ref 80.0–100.0)
Monocytes Absolute: 0.5 10*3/uL (ref 0.1–1.0)
Monocytes Relative: 6 %
Neutro Abs: 5.6 10*3/uL (ref 1.7–7.7)
Neutrophils Relative %: 70 %
Platelets: 212 10*3/uL (ref 150–400)
RBC: 4.85 MIL/uL (ref 3.87–5.11)
RDW: 12.6 % (ref 11.5–15.5)
WBC: 8 10*3/uL (ref 4.0–10.5)
nRBC: 0 % (ref 0.0–0.2)

## 2018-12-30 LAB — BASIC METABOLIC PANEL
Anion gap: 12 (ref 5–15)
BUN: 17 mg/dL (ref 8–23)
CO2: 27 mmol/L (ref 22–32)
Calcium: 9.9 mg/dL (ref 8.9–10.3)
Chloride: 96 mmol/L — ABNORMAL LOW (ref 98–111)
Creatinine, Ser: 0.72 mg/dL (ref 0.44–1.00)
GFR calc Af Amer: >60 mL/min (ref >60)
GFR calc non Af Amer: >60 mL/min (ref >60)
Glucose, Bld: 195 mg/dL — ABNORMAL HIGH (ref 70–99)
Potassium: 3.6 mmol/L (ref 3.5–5.1)
Sodium: 135 mmol/L (ref 135–145)

## 2018-12-30 MED ORDER — AMLODIPINE BESYLATE 5 MG PO TABS
10.0000 mg | ORAL_TABLET | Freq: Once | ORAL | Status: AC
  Administered 2018-12-30: 10 mg via ORAL
  Filled 2018-12-30: qty 2

## 2018-12-30 NOTE — ED Notes (Signed)
Urine sample sent to lab; urine culture tube collected also.

## 2018-12-30 NOTE — Discharge Instructions (Addendum)
Your EKG and blood work were normal.  You should take your medication as it is prescribed to you.  If you start to develop symptoms such as headache, chest pain, shortness of breath, abdominal pain please call your primary care doctor or if they are available return to the emergency department.  Please schedule an appointment with your primary care doctor at the earliest convenient time for you.

## 2018-12-30 NOTE — ED Triage Notes (Signed)
Patient states woke up this am with generalized weakness; states BP elevated this am; states BP at [facility] today 210/110; denies any pain; denies any shortness of breath; denies dizziness. nad noted.

## 2018-12-30 NOTE — ED Provider Notes (Addendum)
[place] EMERGENCY DEPARTMENT Provider Note   CSN: [id number] Arrival date & time: 12/30/18  1919    History   Chief Complaint Chief Complaint  Patient presents with  . Hypertension    HPI Destiny Decker is a 73 y.o. female.     Patient states that when she woke up today she took her Synthroid but did not take her other medications.  At lunchtime she felt a little "weak or tired" and she took her blood pressure at home which was 831 systolic.  She did not take her blood pressure medicine at this time.  She states that she is just being "lazy" and had no other reason for not taking the rest of her medications.  At 4 PM she then took her blood pressure again and it was 200.  Her husband suggested she get her blood pressure taken at the [facility], which she did, and this was 517 systolic.  Patient eventually took 1 of her blood pressure medications, but she does not know which one it is.  Stating only that was a 10 mg one.  She then came to the ED.  Other than the initial weakness or tiredness that she described she has not had any other symptoms.  The symptoms eventually resolved on their own.  She denies headache, vision changes, chest pain, nausea, vomiting, diarrhea, right upper quadrant pain, shortness of breath.  She states that sometimes when she takes her blood pressure she feels a little pain on the left side of her chest but this is only when she takes her blood pressure.  She also states that she has chronic problems with her vision for which she gets injections in her eyes and recently scratched her cornea which is why her eye appears red.     Past Medical History:  Diagnosis Date  . Anal squamous cell carcinoma ([place])   . Complication of anesthesia    HARD TO WAKE  . Diabetes mellitus, type 2 ([place])   . Hemorrhoids   . Hypertension   . Hypothyroidism   . Mild acid reflux   . Nocturia     Patient Active Problem List   Diagnosis Date Noted  . AIN grade III  09/28/2012  . Squamous cell carcinoma of anal skin 08/25/2012    Past Surgical History:  Procedure Laterality Date  . APPENDECTOMY  AGE 44  . EXAMINATION UNDER ANESTHESIA N/A 01/04/2013   Procedure: EXAM UNDER ANESTHESIA;  Surgeon: Leighton Ruff, MD;  Location: [facility];  Service: General;  Laterality: N/A;  . LEFT KNEE ARTHROSCOPY W/ DEBRIDEMENT AND REMOVAL GANGLION CYST  05-17-2008  . TRANSANAL EXCISION OF RECTAL MASS N/A 09/12/2012   Procedure: TRANSANAL EXCISION OF RECTAL MASS;  Surgeon: Leighton Ruff, MD;  Location: [facility];  Service: General;  Laterality: N/A;  . WISDOM TOOTH EXTRACTION       OB History   No obstetric history on file.      Home Medications    Prior to Admission medications   Medication Sig Start Date End Date Taking? Authorizing Provider  amLODipine (NORVASC) 5 MG tablet Take 5 mg by mouth every morning.     [provider]  Aspirin 81 MG EC tablet Take 81 mg by mouth daily.    [provider]  calcium carbonate (TUMS - DOSED IN MG ELEMENTAL CALCIUM) 500 MG chewable tablet Chew 1 tablet by mouth as needed.     [provider]  Cholecalciferol (VITAMIN D3)  1000 UNITS CAPS Take 1 capsule by mouth every morning.    [provider]  clindamycin (CLEOCIN) 150 MG capsule Take 1 capsule (150 mg total) by mouth 4 (four) times daily. 01/30/15   Tuchman, Richard C, DPM  diazepam (VALIUM) 5 MG tablet Take 1 tablet (5 mg total) by mouth every 6 (six) hours as needed (rectal spasms, inability to urinate). 7/93/90   Leighton Ruff, MD  docusate sodium (COLACE) 100 MG capsule Take 1 capsule (100 mg total) by mouth 2 (two) times daily. 3/0/09   Leighton Ruff, MD  erythromycin ophthalmic ointment  01/28/15   [provider]  fish oil-omega-3 fatty acids 1000 MG capsule Take 1 g by mouth 2 (two) times daily.    [provider]  ibuprofen (ADVIL,MOTRIN) 200 MG tablet Take 600 mg by mouth as  needed for pain.    [provider]  levothyroxine (SYNTHROID, LEVOTHROID) 75 MCG tablet Take 75 mcg by mouth daily before breakfast.    [provider]  lisinopril (PRINIVIL,ZESTRIL) 10 MG tablet  12/29/14   [provider]  meloxicam (MOBIC) 15 MG tablet  01/22/15   [provider]  metFORMIN (GLUCOPHAGE) 500 MG tablet Take 500 mg by mouth 2 (two) times daily with a meal.    [provider]  Jonetta Speak LANCETS 23[address] [place]  01/15/15   [provider]  [facility] VERIO test strip  01/15/15   [provider]  oxyCODONE (OXY IR/ROXICODONE) 5 MG immediate release tablet Take 1-2 tablets (5-10 mg total) by mouth every 4 (four) hours as needed for pain. 0/0/76   Leighton Ruff, MD  pravastatin (PRAVACHOL) 40 MG tablet  01/21/15   [provider]  promethazine-codeine (PHENERGAN WITH CODEINE) 6.25-10 MG/5ML syrup  01/24/15   [provider]  Psyllium (DIETARY FIBER LAXATIVE) 28.3 % POWD Take 1 application by mouth 2 (two) times daily. 08/25/31   Leighton Ruff, MD  tiZANidine (ZANAFLEX) 2 MG tablet  11/30/14   [provider]    Family History Family History  Problem Relation Age of Onset  . Diabetes Mother   . Cancer Sister        breast  . Hypertension Sister   . Breast cancer Neg Hx     Social History Social History   Tobacco Use  . Smoking status: Never Smoker  . Smokeless tobacco: Never Used  Substance Use Topics  . Alcohol use: No  . Drug use: No     Allergies   Penicillins   Review of Systems Review of Systems  Constitutional: Positive for fatigue. Negative for fever.  HENT: Negative for hearing loss and sore throat.   Eyes: Negative for visual disturbance.  Respiratory: Negative for cough, chest tightness, shortness of breath and wheezing.   Cardiovascular: Negative for chest pain and palpitations.  Gastrointestinal: Negative for abdominal pain, diarrhea, nausea and vomiting.   Genitourinary: Negative for difficulty urinating and urgency.  Musculoskeletal: Negative for myalgias.  Neurological: Positive for dizziness. Negative for speech difficulty and headaches.     Physical Exam Updated Vital Signs BP (!) 188/72   Pulse 65   Temp 98.3 F (36.8 C) (Oral)   Resp 18   Ht 5\' 6"  (1.676 m)   Wt 70.8 kg   SpO2 97%   BMI 25.19 kg/m   Physical Exam Constitutional:      General: She is not in acute distress.    Appearance: She is normal weight.  HENT:     Head:  Normocephalic and atraumatic.     Nose: Nose normal. No congestion or rhinorrhea.     Mouth/Throat:     Mouth: Mucous membranes are moist.     Pharynx: Oropharynx is clear. No oropharyngeal exudate.  Eyes:     Extraocular Movements: Extraocular movements intact.     Pupils: Pupils are equal, round, and reactive to light.     Comments: Patient has scleral injection medial aspect of her left eye  Cardiovascular:     Rate and Rhythm: Normal rate and regular rhythm.     Pulses: Normal pulses.     Heart sounds: No murmur.  Pulmonary:     Effort: Pulmonary effort is normal. No respiratory distress.     Breath sounds: Normal breath sounds. No wheezing or rhonchi.  Abdominal:     General: Abdomen is flat.     Palpations: Abdomen is soft.     Tenderness: There is abdominal tenderness.     Comments: Mild suprapubic tenderness  Musculoskeletal: Normal range of motion.  Skin:    General: Skin is warm and dry.  Neurological:     General: No focal deficit present.     Mental Status: She is alert and oriented to person, place, and time.     Cranial Nerves: No cranial nerve deficit.      ED Treatments / Results  Labs (all labs ordered are listed, but only abnormal results are displayed) Labs Reviewed  BASIC METABOLIC PANEL - Abnormal; Notable for the following components:      Result Value   Chloride 96 (*)    Glucose, Bld 195 (*)    All other components within normal limits  CBC WITH  DIFFERENTIAL/PLATELET    EKG EKG Interpretation  Date/Time:  Friday December 30 2018 22:58:11 EDT Ventricular Rate:  61 PR Interval:    QRS Duration: 103 QT Interval:  443 QTC Calculation: 447 R Axis:   45 Text Interpretation:  Sinus rhythm Nonspecific T abnormalities, lateral leads Confirmed by Randal Buba, April ([id number]) on 12/30/2018 11:02:57 PM   Radiology No results found.  Procedures Procedures (including critical care time)  Medications Ordered in ED Medications  amLODipine (NORVASC) tablet 10 mg (10 mg Oral Given 12/30/18 2253)     Initial Impression / Assessment and Plan / ED Course  I have reviewed the triage vital signs and the nursing notes.  Pertinent labs & imaging results that were available during my care of the patient were reviewed by me and considered in my medical decision making (see chart for details).  Patient has a 1 day history of elevated blood pressure with a high of 785 systolic in setting of medication noncompliance.  Patient has no reasonable explanation for not taking her medications other than "being lazy".  Patient does not have any other symptoms.  Initially she had some generalized weakness and fatigue earlier this morning but that has since resolved.  In the ED her blood pressures have been consistently elevated in the high 885O to 277A systolic.  Diastolics have been 12-87.  Vitals otherwise stable.  Patient uncertain of which medications she actually takes other than a thyroid medication, a blood sugar medication, and 2 hypertensive medications.  Patient has lisinopril and amlodipine listed in her home meds list, although cannot confirm the disease of the medication she is currently on.  Gave the patient 10 mg of amlodipine.  EKG was normal.  CBC and BMP were normal.  Patient was discharged to home with advice to follow-up with her  PCP/cardiologist as soon as she is able.  Final Clinical Impressions(s) / ED Diagnoses   Final diagnoses:  Asymptomatic  hypertension    ED Discharge Orders    None       Benay Pike, MD 12/30/18 2313    Benay Pike, MD 12/30/18 3976    Elnora Morrison, MD 12/31/18 [phone]

## 2019-02-27 ENCOUNTER — Other Ambulatory Visit: Payer: Self-pay | Admitting: Family Medicine

## 2019-02-27 DIAGNOSIS — Z1231 Encounter for screening mammogram for malignant neoplasm of breast: Secondary | ICD-10-CM

## 2019-04-12 ENCOUNTER — Other Ambulatory Visit: Payer: Self-pay

## 2019-04-12 ENCOUNTER — Ambulatory Visit
Admission: RE | Admit: 2019-04-12 | Discharge: 2019-04-12 | Disposition: A | Discharge status: Home / Self Care | Payer: Medicare Other | Source: Ambulatory Visit | Attending: Family Medicine | Admitting: Family Medicine

## 2019-04-12 DIAGNOSIS — Z1231 Encounter for screening mammogram for malignant neoplasm of breast: Secondary | ICD-10-CM

## 2019-07-19 ENCOUNTER — Ambulatory Visit: Payer: Medicare Other | Attending: Family Medicine

## 2019-07-19 DIAGNOSIS — Z23 Encounter for immunization: Secondary | ICD-10-CM | POA: Insufficient documentation

## 2019-07-19 NOTE — Progress Notes (Signed)
   Covid-19 Vaccination Clinic  Name:  Destiny Decker    MRN: [id number] DOB: 01/07/1946  07/19/2019  Destiny Decker was observed post Covid-19 immunization for 15 minutes without incidence. She was provided with Vaccine Information Sheet and instruction to access the V-Safe system.   Destiny Decker was instructed to call 911 with any severe reactions post vaccine: Marland Kitchen Difficulty breathing  . Swelling of your face and throat  . A fast heartbeat  . A bad rash all over your body  . Dizziness and weakness    Immunizations Administered    Name Date Dose VIS Date Route   Pfizer COVID-19 Vaccine 07/19/2019  2:16 PM 0.3 mL 06/09/2019 Intramuscular   Manufacturer: [employer]   Lot: [id number]   [place]: [id number]

## 2019-08-09 ENCOUNTER — Ambulatory Visit: Payer: Medicare Other | Attending: Internal Medicine

## 2019-08-14 ENCOUNTER — Telehealth: Payer: Self-pay | Admitting: *Deleted

## 2019-08-14 NOTE — Telephone Encounter (Signed)
Left VM for pt to CB to reschedule covid vaccine.  'No show' 08/09/2019 for 2nd vaccine.

## 2019-12-11 ENCOUNTER — Other Ambulatory Visit: Payer: Self-pay | Admitting: Family Medicine

## 2019-12-11 DIAGNOSIS — Z1231 Encounter for screening mammogram for malignant neoplasm of breast: Secondary | ICD-10-CM

## 2020-02-27 DIAGNOSIS — H34812 Central retinal vein occlusion, left eye, with macular edema: Secondary | ICD-10-CM | POA: Diagnosis not present

## 2020-02-27 DIAGNOSIS — H35373 Puckering of macula, bilateral: Secondary | ICD-10-CM | POA: Diagnosis not present

## 2020-02-27 DIAGNOSIS — H3581 Retinal edema: Secondary | ICD-10-CM | POA: Diagnosis not present

## 2020-02-27 DIAGNOSIS — E113293 Type 2 diabetes mellitus with mild nonproliferative diabetic retinopathy without macular edema, bilateral: Secondary | ICD-10-CM | POA: Diagnosis not present

## 2020-03-01 DIAGNOSIS — M791 Myalgia, unspecified site: Secondary | ICD-10-CM | POA: Diagnosis not present

## 2020-03-01 DIAGNOSIS — Z7984 Long term (current) use of oral hypoglycemic drugs: Secondary | ICD-10-CM | POA: Diagnosis not present

## 2020-03-01 DIAGNOSIS — I10 Essential (primary) hypertension: Secondary | ICD-10-CM | POA: Diagnosis not present

## 2020-03-01 DIAGNOSIS — Z79899 Other long term (current) drug therapy: Secondary | ICD-10-CM | POA: Diagnosis not present

## 2020-03-01 DIAGNOSIS — I2510 Atherosclerotic heart disease of native coronary artery without angina pectoris: Secondary | ICD-10-CM | POA: Diagnosis not present

## 2020-03-01 DIAGNOSIS — I493 Ventricular premature depolarization: Secondary | ICD-10-CM | POA: Diagnosis not present

## 2020-03-01 DIAGNOSIS — F419 Anxiety disorder, unspecified: Secondary | ICD-10-CM | POA: Diagnosis not present

## 2020-03-01 DIAGNOSIS — E118 Type 2 diabetes mellitus with unspecified complications: Secondary | ICD-10-CM | POA: Diagnosis not present

## 2020-03-01 DIAGNOSIS — E039 Hypothyroidism, unspecified: Secondary | ICD-10-CM | POA: Diagnosis not present

## 2020-03-01 DIAGNOSIS — Z7902 Long term (current) use of antithrombotics/antiplatelets: Secondary | ICD-10-CM | POA: Diagnosis not present

## 2020-03-01 DIAGNOSIS — E7800 Pure hypercholesterolemia, unspecified: Secondary | ICD-10-CM | POA: Diagnosis not present

## 2020-03-01 DIAGNOSIS — E119 Type 2 diabetes mellitus without complications: Secondary | ICD-10-CM | POA: Diagnosis not present

## 2020-03-01 DIAGNOSIS — R079 Chest pain, unspecified: Secondary | ICD-10-CM | POA: Diagnosis not present

## 2020-03-01 DIAGNOSIS — E785 Hyperlipidemia, unspecified: Secondary | ICD-10-CM | POA: Diagnosis not present

## 2020-03-01 DIAGNOSIS — Z7982 Long term (current) use of aspirin: Secondary | ICD-10-CM | POA: Diagnosis not present

## 2020-03-01 DIAGNOSIS — Z955 Presence of coronary angioplasty implant and graft: Secondary | ICD-10-CM | POA: Diagnosis not present

## 2020-04-06 DIAGNOSIS — Z23 Encounter for immunization: Secondary | ICD-10-CM | POA: Diagnosis not present

## 2020-04-12 ENCOUNTER — Other Ambulatory Visit: Payer: Self-pay

## 2020-04-12 ENCOUNTER — Ambulatory Visit
Admission: RE | Admit: 2020-04-12 | Discharge: 2020-04-12 | Disposition: A | Discharge status: Home / Self Care | Payer: Medicare PPO | Source: Ambulatory Visit | Attending: Family Medicine | Admitting: Family Medicine

## 2020-04-12 DIAGNOSIS — Z1231 Encounter for screening mammogram for malignant neoplasm of breast: Secondary | ICD-10-CM

## 2020-04-23 DIAGNOSIS — S30814A Abrasion of vagina and vulva, initial encounter: Secondary | ICD-10-CM | POA: Diagnosis not present

## 2020-04-23 DIAGNOSIS — R300 Dysuria: Secondary | ICD-10-CM | POA: Diagnosis not present

## 2020-05-10 DIAGNOSIS — R001 Bradycardia, unspecified: Secondary | ICD-10-CM | POA: Diagnosis not present

## 2020-05-10 DIAGNOSIS — M791 Myalgia, unspecified site: Secondary | ICD-10-CM | POA: Diagnosis not present

## 2020-05-10 DIAGNOSIS — R0600 Dyspnea, unspecified: Secondary | ICD-10-CM | POA: Diagnosis not present

## 2020-05-10 DIAGNOSIS — Z955 Presence of coronary angioplasty implant and graft: Secondary | ICD-10-CM | POA: Diagnosis not present

## 2020-05-10 DIAGNOSIS — R079 Chest pain, unspecified: Secondary | ICD-10-CM | POA: Diagnosis not present

## 2020-05-10 DIAGNOSIS — I2510 Atherosclerotic heart disease of native coronary artery without angina pectoris: Secondary | ICD-10-CM | POA: Diagnosis not present

## 2020-05-10 DIAGNOSIS — I10 Essential (primary) hypertension: Secondary | ICD-10-CM | POA: Diagnosis not present

## 2020-05-10 DIAGNOSIS — E118 Type 2 diabetes mellitus with unspecified complications: Secondary | ICD-10-CM | POA: Diagnosis not present

## 2020-05-10 DIAGNOSIS — E7800 Pure hypercholesterolemia, unspecified: Secondary | ICD-10-CM | POA: Diagnosis not present

## 2020-05-10 DIAGNOSIS — E039 Hypothyroidism, unspecified: Secondary | ICD-10-CM | POA: Diagnosis not present

## 2020-06-03 DIAGNOSIS — E039 Hypothyroidism, unspecified: Secondary | ICD-10-CM | POA: Diagnosis not present

## 2020-06-03 DIAGNOSIS — E113291 Type 2 diabetes mellitus with mild nonproliferative diabetic retinopathy without macular edema, right eye: Secondary | ICD-10-CM | POA: Diagnosis not present

## 2020-06-03 DIAGNOSIS — M150 Primary generalized (osteo)arthritis: Secondary | ICD-10-CM | POA: Diagnosis not present

## 2020-06-03 DIAGNOSIS — M791 Myalgia, unspecified site: Secondary | ICD-10-CM | POA: Diagnosis not present

## 2020-06-03 DIAGNOSIS — M85859 Other specified disorders of bone density and structure, unspecified thigh: Secondary | ICD-10-CM | POA: Diagnosis not present

## 2020-06-03 DIAGNOSIS — E782 Mixed hyperlipidemia: Secondary | ICD-10-CM | POA: Diagnosis not present

## 2020-06-03 DIAGNOSIS — I10 Essential (primary) hypertension: Secondary | ICD-10-CM | POA: Diagnosis not present

## 2020-06-03 DIAGNOSIS — N3281 Overactive bladder: Secondary | ICD-10-CM | POA: Diagnosis not present

## 2020-07-02 DIAGNOSIS — M79645 Pain in left finger(s): Secondary | ICD-10-CM | POA: Diagnosis not present

## 2020-07-03 DIAGNOSIS — H35373 Puckering of macula, bilateral: Secondary | ICD-10-CM | POA: Diagnosis not present

## 2020-07-03 DIAGNOSIS — E113293 Type 2 diabetes mellitus with mild nonproliferative diabetic retinopathy without macular edema, bilateral: Secondary | ICD-10-CM | POA: Diagnosis not present

## 2020-07-03 DIAGNOSIS — H34812 Central retinal vein occlusion, left eye, with macular edema: Secondary | ICD-10-CM | POA: Diagnosis not present

## 2020-07-03 DIAGNOSIS — H3581 Retinal edema: Secondary | ICD-10-CM | POA: Diagnosis not present

## 2020-09-03 DIAGNOSIS — E118 Type 2 diabetes mellitus with unspecified complications: Secondary | ICD-10-CM | POA: Diagnosis not present

## 2020-09-03 DIAGNOSIS — R0600 Dyspnea, unspecified: Secondary | ICD-10-CM | POA: Diagnosis not present

## 2020-09-03 DIAGNOSIS — Z955 Presence of coronary angioplasty implant and graft: Secondary | ICD-10-CM | POA: Diagnosis not present

## 2020-09-03 DIAGNOSIS — E7800 Pure hypercholesterolemia, unspecified: Secondary | ICD-10-CM | POA: Diagnosis not present

## 2020-09-03 DIAGNOSIS — M791 Myalgia, unspecified site: Secondary | ICD-10-CM | POA: Diagnosis not present

## 2020-09-03 DIAGNOSIS — I10 Essential (primary) hypertension: Secondary | ICD-10-CM | POA: Diagnosis not present

## 2020-09-03 DIAGNOSIS — R0789 Other chest pain: Secondary | ICD-10-CM | POA: Diagnosis not present

## 2020-09-03 DIAGNOSIS — I2510 Atherosclerotic heart disease of native coronary artery without angina pectoris: Secondary | ICD-10-CM | POA: Diagnosis not present

## 2020-09-03 DIAGNOSIS — E039 Hypothyroidism, unspecified: Secondary | ICD-10-CM | POA: Diagnosis not present

## 2020-09-16 DIAGNOSIS — H3581 Retinal edema: Secondary | ICD-10-CM | POA: Diagnosis not present

## 2020-09-16 DIAGNOSIS — H35373 Puckering of macula, bilateral: Secondary | ICD-10-CM | POA: Diagnosis not present

## 2020-09-16 DIAGNOSIS — E113293 Type 2 diabetes mellitus with mild nonproliferative diabetic retinopathy without macular edema, bilateral: Secondary | ICD-10-CM | POA: Diagnosis not present

## 2020-09-16 DIAGNOSIS — H43811 Vitreous degeneration, right eye: Secondary | ICD-10-CM | POA: Diagnosis not present

## 2020-09-16 DIAGNOSIS — H34812 Central retinal vein occlusion, left eye, with macular edema: Secondary | ICD-10-CM | POA: Diagnosis not present

## 2020-10-07 DIAGNOSIS — H27111 Subluxation of lens, right eye: Secondary | ICD-10-CM | POA: Diagnosis not present

## 2020-10-07 DIAGNOSIS — E113311 Type 2 diabetes mellitus with moderate nonproliferative diabetic retinopathy with macular edema, right eye: Secondary | ICD-10-CM | POA: Diagnosis not present

## 2020-10-07 DIAGNOSIS — H35373 Puckering of macula, bilateral: Secondary | ICD-10-CM | POA: Diagnosis not present

## 2020-10-07 DIAGNOSIS — E113212 Type 2 diabetes mellitus with mild nonproliferative diabetic retinopathy with macular edema, left eye: Secondary | ICD-10-CM | POA: Diagnosis not present

## 2020-10-14 DIAGNOSIS — Z7984 Long term (current) use of oral hypoglycemic drugs: Secondary | ICD-10-CM | POA: Diagnosis not present

## 2020-10-14 DIAGNOSIS — T8522XA Displacement of intraocular lens, initial encounter: Secondary | ICD-10-CM | POA: Diagnosis not present

## 2020-10-14 DIAGNOSIS — I2510 Atherosclerotic heart disease of native coronary artery without angina pectoris: Secondary | ICD-10-CM | POA: Diagnosis not present

## 2020-10-14 DIAGNOSIS — E119 Type 2 diabetes mellitus without complications: Secondary | ICD-10-CM | POA: Diagnosis not present

## 2020-10-14 DIAGNOSIS — I10 Essential (primary) hypertension: Secondary | ICD-10-CM | POA: Diagnosis not present

## 2020-10-14 DIAGNOSIS — Z7901 Long term (current) use of anticoagulants: Secondary | ICD-10-CM | POA: Diagnosis not present

## 2020-10-14 DIAGNOSIS — H27111 Subluxation of lens, right eye: Secondary | ICD-10-CM | POA: Diagnosis not present

## 2020-10-14 DIAGNOSIS — D759 Disease of blood and blood-forming organs, unspecified: Secondary | ICD-10-CM | POA: Diagnosis not present

## 2020-10-14 DIAGNOSIS — E039 Hypothyroidism, unspecified: Secondary | ICD-10-CM | POA: Diagnosis not present

## 2020-10-14 DIAGNOSIS — E785 Hyperlipidemia, unspecified: Secondary | ICD-10-CM | POA: Diagnosis not present

## 2020-11-07 DIAGNOSIS — E113311 Type 2 diabetes mellitus with moderate nonproliferative diabetic retinopathy with macular edema, right eye: Secondary | ICD-10-CM | POA: Diagnosis not present

## 2020-11-27 ENCOUNTER — Other Ambulatory Visit: Payer: Self-pay

## 2020-11-27 ENCOUNTER — Emergency Department (HOSPITAL_BASED_OUTPATIENT_CLINIC_OR_DEPARTMENT_OTHER)
Admission: EM | Admit: 2020-11-27 | Discharge: 2020-11-27 | Disposition: A | Discharge status: Home / Self Care | Payer: Medicare PPO | Attending: Emergency Medicine | Admitting: Emergency Medicine

## 2020-11-27 ENCOUNTER — Encounter (HOSPITAL_BASED_OUTPATIENT_CLINIC_OR_DEPARTMENT_OTHER): Payer: Self-pay | Admitting: *Deleted

## 2020-11-27 ENCOUNTER — Emergency Department (HOSPITAL_BASED_OUTPATIENT_CLINIC_OR_DEPARTMENT_OTHER): Payer: Medicare PPO

## 2020-11-27 DIAGNOSIS — X58XXXA Exposure to other specified factors, initial encounter: Secondary | ICD-10-CM | POA: Diagnosis not present

## 2020-11-27 DIAGNOSIS — I10 Essential (primary) hypertension: Secondary | ICD-10-CM | POA: Insufficient documentation

## 2020-11-27 DIAGNOSIS — S99921A Unspecified injury of right foot, initial encounter: Secondary | ICD-10-CM | POA: Diagnosis not present

## 2020-11-27 DIAGNOSIS — Y990 Civilian activity done for income or pay: Secondary | ICD-10-CM | POA: Insufficient documentation

## 2020-11-27 DIAGNOSIS — Z79899 Other long term (current) drug therapy: Secondary | ICD-10-CM | POA: Insufficient documentation

## 2020-11-27 DIAGNOSIS — H35372 Puckering of macula, left eye: Secondary | ICD-10-CM | POA: Diagnosis not present

## 2020-11-27 DIAGNOSIS — S90934A Unspecified superficial injury of right lesser toe(s), initial encounter: Secondary | ICD-10-CM | POA: Diagnosis not present

## 2020-11-27 DIAGNOSIS — Z7984 Long term (current) use of oral hypoglycemic drugs: Secondary | ICD-10-CM | POA: Insufficient documentation

## 2020-11-27 DIAGNOSIS — S92424A Nondisplaced fracture of distal phalanx of right great toe, initial encounter for closed fracture: Secondary | ICD-10-CM | POA: Insufficient documentation

## 2020-11-27 DIAGNOSIS — E113213 Type 2 diabetes mellitus with mild nonproliferative diabetic retinopathy with macular edema, bilateral: Secondary | ICD-10-CM | POA: Diagnosis not present

## 2020-11-27 DIAGNOSIS — Z7982 Long term (current) use of aspirin: Secondary | ICD-10-CM | POA: Insufficient documentation

## 2020-11-27 DIAGNOSIS — E039 Hypothyroidism, unspecified: Secondary | ICD-10-CM | POA: Diagnosis not present

## 2020-11-27 DIAGNOSIS — E119 Type 2 diabetes mellitus without complications: Secondary | ICD-10-CM | POA: Insufficient documentation

## 2020-11-27 DIAGNOSIS — Z8544 Personal history of malignant neoplasm of other female genital organs: Secondary | ICD-10-CM | POA: Insufficient documentation

## 2020-11-27 MED ORDER — SILVER NITRATE-POT NITRATE 75-25 % EX MISC
1.0000 "application " | Freq: Once | CUTANEOUS | Status: DC
  Filled 2020-11-27: qty 10

## 2020-11-27 NOTE — ED Provider Notes (Signed)
[place] [facility] Provider Note   CSN: [id number] Arrival date & time: 11/27/20  2011     History Chief Complaint  Patient presents with  . Toe Injury    Destiny Decker is a 75 y.o. female, past ministry of diabetes, hypertension, hypothyroidism who presents for evaluation of injury to right big toe that occurred about 4 hours ago.  Patient states that she was at work when she laid down and saw blood on her shoe.  She thinks she may have hit it against something but is unsure.  She states that she has tried wrapping it but is continued to bleed.  She is currently on Coumadin.  She states her tetanus was in the last 3 years.  She states it does not hurt.  No numbness.  The history is provided by the patient.       Past Medical History:  Diagnosis Date  . Anal squamous cell carcinoma ([place])   . Complication of anesthesia    HARD TO WAKE  . Diabetes mellitus, type 2 ([place])   . Hemorrhoids   . Hypertension   . Hypothyroidism   . Mild acid reflux   . Nocturia     Patient Active Problem List   Diagnosis Date Noted  . AIN grade III 09/28/2012  . Squamous cell carcinoma of anal skin 08/25/2012    Past Surgical History:  Procedure Laterality Date  . APPENDECTOMY  AGE 59  . EXAMINATION UNDER ANESTHESIA N/A 01/04/2013   Procedure: EXAM UNDER ANESTHESIA;  Surgeon: Leighton Ruff, MD;  Location: [facility];  Service: General;  Laterality: N/A;  . LEFT KNEE ARTHROSCOPY W/ DEBRIDEMENT AND REMOVAL GANGLION CYST  05-17-2008  . TRANSANAL EXCISION OF RECTAL MASS N/A 09/12/2012   Procedure: TRANSANAL EXCISION OF RECTAL MASS;  Surgeon: Leighton Ruff, MD;  Location: [facility];  Service: General;  Laterality: N/A;  . WISDOM TOOTH EXTRACTION       OB History   No obstetric history on file.     Family History  Problem Relation Age of Onset  . Diabetes Mother   . Cancer Sister        breast  . Hypertension Sister   . Breast cancer Neg  Hx     Social History   Tobacco Use  . Smoking status: Never Smoker  . Smokeless tobacco: Never Used  Substance Use Topics  . Alcohol use: No  . Drug use: No    Home Medications Prior to Admission medications   Medication Sig Start Date End Date Taking? Authorizing Provider  amLODipine (NORVASC) 5 MG tablet Take 5 mg by mouth every morning.    Yes [provider]  Aspirin 81 MG EC tablet Take 81 mg by mouth daily.   Yes [provider]  calcium carbonate (TUMS - DOSED IN MG ELEMENTAL CALCIUM) 500 MG chewable tablet Chew 1 tablet by mouth as needed.    Yes [provider]  Cholecalciferol (VITAMIN D3) 1000 UNITS CAPS Take 1 capsule by mouth every morning.   Yes [provider]  fish oil-omega-3 fatty acids 1000 MG capsule Take 1 g by mouth 2 (two) times daily.   Yes [provider]  ibuprofen (ADVIL,MOTRIN) 200 MG tablet Take 600 mg by mouth as needed for pain.   Yes [provider]  levothyroxine (SYNTHROID, LEVOTHROID) 75 MCG tablet Take 75 mcg by mouth daily before breakfast.   Yes [provider]  meloxicam (MOBIC) 15 MG tablet  01/22/15  Yes [provider]  metFORMIN (GLUCOPHAGE) 500 MG tablet Take 500 mg by mouth 2 (two) times daily with a meal.   Yes [provider]  Jonetta Speak LANCETS [address] [place]  01/15/15  Yes [provider]  ONETOUCH VERIO test strip  01/15/15  Yes [provider]  clindamycin (CLEOCIN) 150 MG capsule Take 1 capsule (150 mg total) by mouth 4 (four) times daily. 01/30/15   Tuchman, Richard C, DPM  diazepam (VALIUM) 5 MG tablet Take 1 tablet (5 mg total) by mouth every 6 (six) hours as needed (rectal spasms, inability to urinate). 0/17/51   Leighton Ruff, MD  docusate sodium (COLACE) 100 MG capsule Take 1 capsule (100 mg total) by mouth 2 (two) times daily. 0/2/58   Leighton Ruff, MD  erythromycin ophthalmic ointment  01/28/15   [provider]  lisinopril  (PRINIVIL,ZESTRIL) 10 MG tablet  12/29/14   [provider]  oxyCODONE (OXY IR/ROXICODONE) 5 MG immediate release tablet Take 1-2 tablets (5-10 mg total) by mouth every 4 (four) hours as needed for pain. 10/29/75   Leighton Ruff, MD  pravastatin (PRAVACHOL) 40 MG tablet  01/21/15   [provider]  promethazine-codeine (PHENERGAN WITH CODEINE) 6.25-10 MG/5ML syrup  01/24/15   [provider]  Psyllium (DIETARY FIBER LAXATIVE) 28.3 % POWD Take 1 application by mouth 2 (two) times daily. 02/19/22   Leighton Ruff, MD  tiZANidine (ZANAFLEX) 2 MG tablet  11/30/14   [provider]    Allergies    Penicillins  Review of Systems   Review of Systems  Skin: Positive for wound.  Neurological: Negative for numbness.  All other systems reviewed and are negative.   Physical Exam Updated Vital Signs BP (!) 175/90 (BP Location: Right Arm)   Pulse (!) 59   Temp 98.2 F (36.8 C)   Resp 15   Ht 5\' 6"  (1.676 m)   Wt 68.9 kg   SpO2 95%   BMI 24.53 kg/m   Physical Exam Vitals and nursing note reviewed.  Constitutional:      Appearance: She is well-developed.  HENT:     Head: Normocephalic and atraumatic.  Eyes:     General: No scleral icterus.       Right eye: No discharge.        Left eye: No discharge.     Conjunctiva/sclera: Conjunctivae normal.  Cardiovascular:     Pulses:          Dorsalis pedis pulses are 2+ on the right side and 2+ on the left side.  Pulmonary:     Effort: Pulmonary effort is normal.  Musculoskeletal:     Comments: It appears that the right toenail of the first toe was slightly lifted up.  It is not displaced and is attached both laterally.  No laceration noted.  She does have a small area of oozing/bleeding in the lateral aspect of the toe.  No open wound, laceration.    No bony tenderness, deformity noted right first toe.  Flexion/tension intact any difficulty.  Skin:    General: Skin is warm and dry.     Comments: Good distal cap  refill. RLE is not dusky in appearance or cool to touch.  Neurological:     Mental Status: She is alert.  Psychiatric:        Speech: Speech normal.        Behavior: Behavior normal.     ED Results / Procedures / Treatments  Labs (all labs ordered are listed, but only abnormal results are displayed) Labs Reviewed - No data to display  EKG None  Radiology DG Toe Great Right  Result Date: 11/27/2020 CLINICAL DATA:  Right big toe injury. EXAM: RIGHT GREAT TOE COMPARISON:  None. FINDINGS: Questionable corner fracture at the base of the right great toe distal phalanx. No subluxation or dislocation. Soft tissues are intact. IMPRESSION: Questionable corner fracture the base of the right great toe distal phalanx. Electronically Signed   By: Rolm Baptise M.D.   On: 11/27/2020 20:58    Procedures Procedures   Medications Ordered in ED Medications  silver nitrate applicators applicator 1 application (has no administration in time range)    ED Course  I have reviewed the triage vital signs and the nursing notes.  Pertinent labs & imaging results that were available during my care of the patient were reviewed by me and considered in my medical decision making (see chart for details).    MDM Rules/Calculators/A&P                          75 year old female who presents for evaluation of bleeding noted to the right first toe.  States she thinks she hit it on something.  She is on Coumadin.  On initial arrival, she is afebrile, toxic appearing vitals are stable.  She is neurovascularly intact.  Right first toe is bleeding.  It looks like she may have lifted nail and is having some bleeding from that.  The nail is still attached and intact.  No sign of laceration.  She does have a little bit of oozing from the lateral aspect towards the proximal nailbed but no open wound, laceration.  We will plan for x-ray to ensure no acute broken bone.  We will plan for wound care.  XR reviewed. There is  a questionable corner fracture at the base of the right toe distal phalanx.  She does not have any pain that correlates.  This is to the medial aspect at the base of the toe which does not correlate to where she is having the bleeding.  Do not suspect this represents an open fracture.  Reevaluation.  Silver nitrate applied with some hemostasis.  We will also apply quick clot.  Reevaluation.  Patient did have some bleeding through the quick clot.  We will redress.  Reevaluation.  Patient has had the second resting on for about 20 minutes.  She has not bled through the dressing.  We will plan to discharge home with dressing on.  Patient instructed to remove it tomorrow.  Encouraged at home supportive care measures.  At this time, there is questionable x-ray finding of a fracture.  Patient has no pain that correlates this area.  Additionally, this is on the opposite side of where she is having bleeding and do not feel that this represents open fracture.  I discussed with Dr. Lorretta Harp who evaluated the patient.  At this time, will not put patient on antibiotics as this does not likely reflect open fracture. At this time, patient exhibits no emergent life-threatening condition that require further evaluation in ED. Patient had ample opportunity for questions and discussion. All patient's questions were answered with full understanding. Strict return precautions discussed. Patient expresses understanding and agreement to plan.   Portions of this note were generated with Lobbyist. Dictation errors may occur despite best attempts at proofreading.   Final Clinical Impression(s) / ED Diagnoses  Final diagnoses:  Injury of toe on right foot, initial encounter  Closed nondisplaced fracture of distal phalanx of right great toe, initial encounter    Rx / DC Orders ED Discharge Orders    None       Desma Mcgregor 11/27/20 2323    Isla Pence, MD 11/28/20 2149

## 2020-11-27 NOTE — ED Notes (Signed)
Applied hemostatic dressing, soaked thru the first one in about 10 mins, replaced with another heomstatic dressing and wrapped in place. Will check in another 15 mins.

## 2020-11-27 NOTE — Discharge Instructions (Signed)
You can take 1000 mg of Tylenol.  Do not exceed 4000 mg of Tylenol a day.  Keep the dressing on the toe till tomorrow.  He can remove it tomorrow.  Keep the wound clean and dry.  You follow-up with the referred foot doctor.  Return to emergency department if it starts bleeding, you have worsening pain, redness or swelling, fevers or any other worsening concerning symptoms.

## 2020-11-27 NOTE — ED Triage Notes (Addendum)
C/o right big toe injury x 4 hrs ago at work , cont to bleed , pt reports on blood thinner

## 2020-11-27 NOTE — ED Notes (Signed)
Cleaned great toe with sterile water  Dressing applied

## 2020-12-06 ENCOUNTER — Other Ambulatory Visit: Payer: Self-pay | Admitting: Family Medicine

## 2020-12-06 DIAGNOSIS — Z1231 Encounter for screening mammogram for malignant neoplasm of breast: Secondary | ICD-10-CM

## 2020-12-06 DIAGNOSIS — E782 Mixed hyperlipidemia: Secondary | ICD-10-CM | POA: Diagnosis not present

## 2020-12-06 DIAGNOSIS — Z0000 Encounter for general adult medical examination without abnormal findings: Secondary | ICD-10-CM | POA: Diagnosis not present

## 2020-12-06 DIAGNOSIS — M85859 Other specified disorders of bone density and structure, unspecified thigh: Secondary | ICD-10-CM | POA: Diagnosis not present

## 2020-12-06 DIAGNOSIS — E1169 Type 2 diabetes mellitus with other specified complication: Secondary | ICD-10-CM | POA: Diagnosis not present

## 2020-12-06 DIAGNOSIS — N3281 Overactive bladder: Secondary | ICD-10-CM | POA: Diagnosis not present

## 2020-12-06 DIAGNOSIS — E039 Hypothyroidism, unspecified: Secondary | ICD-10-CM | POA: Diagnosis not present

## 2020-12-06 DIAGNOSIS — S91209A Unspecified open wound of unspecified toe(s) with damage to nail, initial encounter: Secondary | ICD-10-CM | POA: Diagnosis not present

## 2020-12-06 DIAGNOSIS — I10 Essential (primary) hypertension: Secondary | ICD-10-CM | POA: Diagnosis not present

## 2020-12-09 ENCOUNTER — Ambulatory Visit: Payer: Medicare PPO | Admitting: Podiatry

## 2020-12-09 ENCOUNTER — Ambulatory Visit (INDEPENDENT_AMBULATORY_CARE_PROVIDER_SITE_OTHER): Payer: Medicare PPO

## 2020-12-09 ENCOUNTER — Encounter: Payer: Self-pay | Admitting: Podiatry

## 2020-12-09 ENCOUNTER — Other Ambulatory Visit: Payer: Self-pay

## 2020-12-09 DIAGNOSIS — L03031 Cellulitis of right toe: Secondary | ICD-10-CM | POA: Diagnosis not present

## 2020-12-09 DIAGNOSIS — S99921A Unspecified injury of right foot, initial encounter: Secondary | ICD-10-CM

## 2020-12-09 NOTE — Patient Instructions (Signed)

## 2020-12-10 NOTE — Progress Notes (Signed)
Subjective:   Patient ID: Destiny Decker, female   DOB: 75 y.o.   MRN: [id number]   HPI Patient states that she traumatized her right big toe and its been getting worse and thicker and making it hard to wear shoe gear and she knows she is to lose the nail.  States it was bleeding extensively and she had stopped at the emergency room but its been giving her trouble.  Patient does not smoke and is not particularly active   Review of Systems  All other systems reviewed and are negative.      Objective:  Physical Exam Vitals and nursing note reviewed.  Constitutional:      Appearance: She is well-developed.  Pulmonary:     Effort: Pulmonary effort is normal.  Musculoskeletal:        General: Normal range of motion.  Skin:    General: Skin is warm.  Neurological:     Mental Status: She is alert.    Neurovascular status intact muscle strength found to be adequate range of motion adequate with patient who has Coumadin chronic usage and does have bleeding issues.  The right hallux nail has completely lifted it does have a thick type surface underneath it where there is probably been bleeding with a paronychia like infection of both medial lateral and proximal portion of the nailbed.  Patient is found to have good digital perfusion well oriented x3     Assessment:  Advanced paronychia infection of the right hallux affecting the nailbed and lifting the bed     Plan:  H&P reviewed condition and today I recommended removal of the nail removing all prep/abscess tissue necrosis and I infiltrated the right hallux 60 mg like Marcaine mixture sterile prep done and I went ahead and using sterile instrumentation I remove the nail entirely removed all prep/abscess tissue necrotic tissue flushed out the bed applied sterile dressing with continued compression.  Reappoint to recheck

## 2020-12-12 ENCOUNTER — Other Ambulatory Visit: Payer: Self-pay

## 2020-12-12 ENCOUNTER — Encounter: Payer: Self-pay | Admitting: Podiatry

## 2020-12-12 ENCOUNTER — Ambulatory Visit (INDEPENDENT_AMBULATORY_CARE_PROVIDER_SITE_OTHER): Payer: Medicare PPO | Admitting: Podiatry

## 2020-12-12 DIAGNOSIS — L03031 Cellulitis of right toe: Secondary | ICD-10-CM

## 2020-12-12 NOTE — Progress Notes (Signed)
Subjective:   Patient ID: Destiny Decker, female   DOB: 75 y.o.   MRN: [id number]   HPI Patient states she is still bleeding somewhat around the right hallux nail that we had removed with patient on COVID   ROS      Objective:  Physical Exam  Neurovascular status intact with patient found to have bleeding of the nailbed right hallux after removal of nail several days ago     Assessment:  Excessive bleeding of the right hallux nail bed     Plan:  Went ahead and utilized Lumicain to try to reduce the bleeding and applied compression with sterile dressing I want her to leave on for 5 days and this should stop all bleeding.  Reappoint as needed but hopefully this will solve her problem

## 2020-12-19 DIAGNOSIS — E113592 Type 2 diabetes mellitus with proliferative diabetic retinopathy without macular edema, left eye: Secondary | ICD-10-CM | POA: Diagnosis not present

## 2020-12-19 DIAGNOSIS — H34812 Central retinal vein occlusion, left eye, with macular edema: Secondary | ICD-10-CM | POA: Diagnosis not present

## 2020-12-19 DIAGNOSIS — H35372 Puckering of macula, left eye: Secondary | ICD-10-CM | POA: Diagnosis not present

## 2020-12-19 DIAGNOSIS — E113311 Type 2 diabetes mellitus with moderate nonproliferative diabetic retinopathy with macular edema, right eye: Secondary | ICD-10-CM | POA: Diagnosis not present

## 2020-12-26 DIAGNOSIS — E113311 Type 2 diabetes mellitus with moderate nonproliferative diabetic retinopathy with macular edema, right eye: Secondary | ICD-10-CM | POA: Diagnosis not present

## 2021-02-27 DIAGNOSIS — H35372 Puckering of macula, left eye: Secondary | ICD-10-CM | POA: Diagnosis not present

## 2021-02-27 DIAGNOSIS — E113592 Type 2 diabetes mellitus with proliferative diabetic retinopathy without macular edema, left eye: Secondary | ICD-10-CM | POA: Diagnosis not present

## 2021-02-27 DIAGNOSIS — H34812 Central retinal vein occlusion, left eye, with macular edema: Secondary | ICD-10-CM | POA: Diagnosis not present

## 2021-02-27 DIAGNOSIS — E113311 Type 2 diabetes mellitus with moderate nonproliferative diabetic retinopathy with macular edema, right eye: Secondary | ICD-10-CM | POA: Diagnosis not present

## 2021-03-07 DIAGNOSIS — Z955 Presence of coronary angioplasty implant and graft: Secondary | ICD-10-CM | POA: Diagnosis not present

## 2021-03-07 DIAGNOSIS — I2510 Atherosclerotic heart disease of native coronary artery without angina pectoris: Secondary | ICD-10-CM | POA: Diagnosis not present

## 2021-03-07 DIAGNOSIS — E7800 Pure hypercholesterolemia, unspecified: Secondary | ICD-10-CM | POA: Diagnosis not present

## 2021-03-07 DIAGNOSIS — E118 Type 2 diabetes mellitus with unspecified complications: Secondary | ICD-10-CM | POA: Diagnosis not present

## 2021-03-07 DIAGNOSIS — R0789 Other chest pain: Secondary | ICD-10-CM | POA: Diagnosis not present

## 2021-03-07 DIAGNOSIS — I10 Essential (primary) hypertension: Secondary | ICD-10-CM | POA: Diagnosis not present

## 2021-03-07 DIAGNOSIS — R0600 Dyspnea, unspecified: Secondary | ICD-10-CM | POA: Diagnosis not present

## 2021-03-07 DIAGNOSIS — M791 Myalgia, unspecified site: Secondary | ICD-10-CM | POA: Diagnosis not present

## 2021-03-07 DIAGNOSIS — E039 Hypothyroidism, unspecified: Secondary | ICD-10-CM | POA: Diagnosis not present

## 2021-03-08 DIAGNOSIS — R001 Bradycardia, unspecified: Secondary | ICD-10-CM | POA: Diagnosis not present

## 2021-03-08 DIAGNOSIS — I2510 Atherosclerotic heart disease of native coronary artery without angina pectoris: Secondary | ICD-10-CM | POA: Diagnosis not present

## 2021-04-25 DIAGNOSIS — E113311 Type 2 diabetes mellitus with moderate nonproliferative diabetic retinopathy with macular edema, right eye: Secondary | ICD-10-CM | POA: Diagnosis not present

## 2021-04-25 DIAGNOSIS — H35373 Puckering of macula, bilateral: Secondary | ICD-10-CM | POA: Diagnosis not present

## 2021-04-25 DIAGNOSIS — E113592 Type 2 diabetes mellitus with proliferative diabetic retinopathy without macular edema, left eye: Secondary | ICD-10-CM | POA: Diagnosis not present

## 2021-04-25 DIAGNOSIS — H34812 Central retinal vein occlusion, left eye, with macular edema: Secondary | ICD-10-CM | POA: Diagnosis not present

## 2021-05-08 DIAGNOSIS — Z23 Encounter for immunization: Secondary | ICD-10-CM | POA: Diagnosis not present

## 2021-05-26 DIAGNOSIS — E113592 Type 2 diabetes mellitus with proliferative diabetic retinopathy without macular edema, left eye: Secondary | ICD-10-CM | POA: Diagnosis not present

## 2021-05-26 DIAGNOSIS — H35373 Puckering of macula, bilateral: Secondary | ICD-10-CM | POA: Diagnosis not present

## 2021-05-26 DIAGNOSIS — E113311 Type 2 diabetes mellitus with moderate nonproliferative diabetic retinopathy with macular edema, right eye: Secondary | ICD-10-CM | POA: Diagnosis not present

## 2021-05-26 DIAGNOSIS — T8522XA Displacement of intraocular lens, initial encounter: Secondary | ICD-10-CM | POA: Diagnosis not present

## 2021-05-27 ENCOUNTER — Other Ambulatory Visit: Payer: Medicare PPO

## 2021-05-27 ENCOUNTER — Ambulatory Visit: Payer: Medicare PPO

## 2021-06-04 DIAGNOSIS — H35371 Puckering of macula, right eye: Secondary | ICD-10-CM | POA: Diagnosis not present

## 2021-06-04 DIAGNOSIS — T8522XA Displacement of intraocular lens, initial encounter: Secondary | ICD-10-CM | POA: Diagnosis not present

## 2021-06-12 DIAGNOSIS — H3581 Retinal edema: Secondary | ICD-10-CM | POA: Diagnosis not present

## 2021-06-12 DIAGNOSIS — E113311 Type 2 diabetes mellitus with moderate nonproliferative diabetic retinopathy with macular edema, right eye: Secondary | ICD-10-CM | POA: Diagnosis not present

## 2021-06-12 DIAGNOSIS — H35372 Puckering of macula, left eye: Secondary | ICD-10-CM | POA: Diagnosis not present

## 2021-06-25 ENCOUNTER — Ambulatory Visit: Payer: Medicare PPO | Admitting: Podiatry

## 2021-06-25 DIAGNOSIS — M150 Primary generalized (osteo)arthritis: Secondary | ICD-10-CM | POA: Diagnosis not present

## 2021-06-25 DIAGNOSIS — M791 Myalgia, unspecified site: Secondary | ICD-10-CM | POA: Diagnosis not present

## 2021-06-25 DIAGNOSIS — E039 Hypothyroidism, unspecified: Secondary | ICD-10-CM | POA: Diagnosis not present

## 2021-06-25 DIAGNOSIS — N3281 Overactive bladder: Secondary | ICD-10-CM | POA: Diagnosis not present

## 2021-06-25 DIAGNOSIS — E782 Mixed hyperlipidemia: Secondary | ICD-10-CM | POA: Diagnosis not present

## 2021-06-25 DIAGNOSIS — E113291 Type 2 diabetes mellitus with mild nonproliferative diabetic retinopathy without macular edema, right eye: Secondary | ICD-10-CM | POA: Diagnosis not present

## 2021-06-25 DIAGNOSIS — M85859 Other specified disorders of bone density and structure, unspecified thigh: Secondary | ICD-10-CM | POA: Diagnosis not present

## 2021-06-25 DIAGNOSIS — I10 Essential (primary) hypertension: Secondary | ICD-10-CM | POA: Diagnosis not present

## 2021-06-26 DIAGNOSIS — H35372 Puckering of macula, left eye: Secondary | ICD-10-CM | POA: Diagnosis not present

## 2021-06-26 DIAGNOSIS — E113311 Type 2 diabetes mellitus with moderate nonproliferative diabetic retinopathy with macular edema, right eye: Secondary | ICD-10-CM | POA: Diagnosis not present

## 2021-07-24 ENCOUNTER — Ambulatory Visit: Payer: Medicare PPO | Admitting: Podiatry

## 2021-07-24 ENCOUNTER — Ambulatory Visit (INDEPENDENT_AMBULATORY_CARE_PROVIDER_SITE_OTHER): Payer: Medicare PPO

## 2021-07-24 ENCOUNTER — Encounter: Payer: Self-pay | Admitting: Podiatry

## 2021-07-24 ENCOUNTER — Other Ambulatory Visit: Payer: Self-pay

## 2021-07-24 DIAGNOSIS — H34812 Central retinal vein occlusion, left eye, with macular edema: Secondary | ICD-10-CM | POA: Diagnosis not present

## 2021-07-24 DIAGNOSIS — M2042 Other hammer toe(s) (acquired), left foot: Secondary | ICD-10-CM

## 2021-07-24 DIAGNOSIS — E113311 Type 2 diabetes mellitus with moderate nonproliferative diabetic retinopathy with macular edema, right eye: Secondary | ICD-10-CM | POA: Diagnosis not present

## 2021-07-24 DIAGNOSIS — M2041 Other hammer toe(s) (acquired), right foot: Secondary | ICD-10-CM

## 2021-07-27 NOTE — Progress Notes (Signed)
Subjective:   Patient ID: Destiny Decker, female   DOB: 76 y.o.   MRN: [id number]   HPI Patient complains of increased discomfort in the second digit bilateral and is concerned about the elevation of the toes and whether or not this will be a long-term problem.  Tried shoe gear modification   ROS      Objective:  Physical Exam  Neurovascular status intact with moderate elevation of the second digit of both feet with slight redness but no other pathology associated with the position of the toes with rigid contracture noted      Assessment:  Digital deformity of the second digit both feet consistent with hammertoe like deformity moderate in its nature and intensity     Plan:  H&P x-rays reviewed education rendered and recommended cushioning but no surgery at this point even though at 1 point in future digital procedures may be necessary.  Patient will be seen back as needed with shoe gear modifications also to be attempted at this time  X-rays indicate there is moderate elevation of the second digit bilateral no other pathology noted

## 2021-07-29 DIAGNOSIS — E7800 Pure hypercholesterolemia, unspecified: Secondary | ICD-10-CM | POA: Diagnosis not present

## 2021-07-29 DIAGNOSIS — E039 Hypothyroidism, unspecified: Secondary | ICD-10-CM | POA: Diagnosis not present

## 2021-07-29 DIAGNOSIS — I10 Essential (primary) hypertension: Secondary | ICD-10-CM | POA: Diagnosis not present

## 2021-07-29 DIAGNOSIS — Z955 Presence of coronary angioplasty implant and graft: Secondary | ICD-10-CM | POA: Diagnosis not present

## 2021-07-29 DIAGNOSIS — E118 Type 2 diabetes mellitus with unspecified complications: Secondary | ICD-10-CM | POA: Diagnosis not present

## 2021-07-29 DIAGNOSIS — M791 Myalgia, unspecified site: Secondary | ICD-10-CM | POA: Diagnosis not present

## 2021-07-29 DIAGNOSIS — R0609 Other forms of dyspnea: Secondary | ICD-10-CM | POA: Diagnosis not present

## 2021-07-29 DIAGNOSIS — I2510 Atherosclerotic heart disease of native coronary artery without angina pectoris: Secondary | ICD-10-CM | POA: Diagnosis not present

## 2021-07-29 DIAGNOSIS — R0789 Other chest pain: Secondary | ICD-10-CM | POA: Diagnosis not present

## 2021-07-30 DIAGNOSIS — Z79899 Other long term (current) drug therapy: Secondary | ICD-10-CM | POA: Diagnosis not present

## 2021-07-30 DIAGNOSIS — I2510 Atherosclerotic heart disease of native coronary artery without angina pectoris: Secondary | ICD-10-CM | POA: Diagnosis not present

## 2021-08-07 DIAGNOSIS — H34812 Central retinal vein occlusion, left eye, with macular edema: Secondary | ICD-10-CM | POA: Diagnosis not present

## 2021-09-08 DIAGNOSIS — H35052 Retinal neovascularization, unspecified, left eye: Secondary | ICD-10-CM | POA: Diagnosis not present

## 2021-09-08 DIAGNOSIS — H35372 Puckering of macula, left eye: Secondary | ICD-10-CM | POA: Diagnosis not present

## 2021-09-08 DIAGNOSIS — E113311 Type 2 diabetes mellitus with moderate nonproliferative diabetic retinopathy with macular edema, right eye: Secondary | ICD-10-CM | POA: Diagnosis not present

## 2021-09-08 DIAGNOSIS — H35033 Hypertensive retinopathy, bilateral: Secondary | ICD-10-CM | POA: Diagnosis not present

## 2021-09-08 DIAGNOSIS — H34812 Central retinal vein occlusion, left eye, with macular edema: Secondary | ICD-10-CM | POA: Diagnosis not present

## 2021-09-15 DIAGNOSIS — L821 Other seborrheic keratosis: Secondary | ICD-10-CM | POA: Diagnosis not present

## 2021-09-15 DIAGNOSIS — B0089 Other herpesviral infection: Secondary | ICD-10-CM | POA: Diagnosis not present

## 2021-09-17 DIAGNOSIS — H34812 Central retinal vein occlusion, left eye, with macular edema: Secondary | ICD-10-CM | POA: Diagnosis not present

## 2021-09-17 DIAGNOSIS — H35052 Retinal neovascularization, unspecified, left eye: Secondary | ICD-10-CM | POA: Diagnosis not present

## 2021-09-17 DIAGNOSIS — H35372 Puckering of macula, left eye: Secondary | ICD-10-CM | POA: Diagnosis not present

## 2021-09-25 DIAGNOSIS — H34812 Central retinal vein occlusion, left eye, with macular edema: Secondary | ICD-10-CM | POA: Diagnosis not present

## 2021-10-15 ENCOUNTER — Ambulatory Visit
Admission: RE | Admit: 2021-10-15 | Discharge: 2021-10-15 | Disposition: A | Discharge status: Home / Self Care | Payer: Medicare PPO | Source: Ambulatory Visit | Attending: Family Medicine | Admitting: Family Medicine

## 2021-10-15 DIAGNOSIS — M85859 Other specified disorders of bone density and structure, unspecified thigh: Secondary | ICD-10-CM

## 2021-10-15 DIAGNOSIS — Z780 Asymptomatic menopausal state: Secondary | ICD-10-CM | POA: Diagnosis not present

## 2021-10-15 DIAGNOSIS — Z1231 Encounter for screening mammogram for malignant neoplasm of breast: Secondary | ICD-10-CM

## 2021-10-15 DIAGNOSIS — M8589 Other specified disorders of bone density and structure, multiple sites: Secondary | ICD-10-CM | POA: Diagnosis not present

## 2021-10-16 DIAGNOSIS — E113311 Type 2 diabetes mellitus with moderate nonproliferative diabetic retinopathy with macular edema, right eye: Secondary | ICD-10-CM | POA: Diagnosis not present

## 2021-10-16 DIAGNOSIS — H34812 Central retinal vein occlusion, left eye, with macular edema: Secondary | ICD-10-CM | POA: Diagnosis not present

## 2021-11-04 DIAGNOSIS — J069 Acute upper respiratory infection, unspecified: Secondary | ICD-10-CM | POA: Diagnosis not present

## 2021-11-04 DIAGNOSIS — R059 Cough, unspecified: Secondary | ICD-10-CM | POA: Diagnosis not present

## 2021-11-04 DIAGNOSIS — J029 Acute pharyngitis, unspecified: Secondary | ICD-10-CM | POA: Diagnosis not present

## 2021-11-17 DIAGNOSIS — H34812 Central retinal vein occlusion, left eye, with macular edema: Secondary | ICD-10-CM | POA: Diagnosis not present

## 2021-11-17 DIAGNOSIS — H3582 Retinal ischemia: Secondary | ICD-10-CM | POA: Diagnosis not present

## 2021-11-17 DIAGNOSIS — E113311 Type 2 diabetes mellitus with moderate nonproliferative diabetic retinopathy with macular edema, right eye: Secondary | ICD-10-CM | POA: Diagnosis not present

## 2021-11-21 ENCOUNTER — Encounter (HOSPITAL_BASED_OUTPATIENT_CLINIC_OR_DEPARTMENT_OTHER): Payer: Self-pay | Admitting: Emergency Medicine

## 2021-11-21 ENCOUNTER — Emergency Department (HOSPITAL_BASED_OUTPATIENT_CLINIC_OR_DEPARTMENT_OTHER)
Admission: EM | Admit: 2021-11-21 | Discharge: 2021-11-21 | Disposition: A | Discharge status: Home / Self Care | Payer: Medicare PPO | Attending: Emergency Medicine | Admitting: Emergency Medicine

## 2021-11-21 ENCOUNTER — Other Ambulatory Visit: Payer: Self-pay

## 2021-11-21 ENCOUNTER — Emergency Department (HOSPITAL_BASED_OUTPATIENT_CLINIC_OR_DEPARTMENT_OTHER): Payer: Medicare PPO

## 2021-11-21 DIAGNOSIS — S9032XA Contusion of left foot, initial encounter: Secondary | ICD-10-CM | POA: Insufficient documentation

## 2021-11-21 DIAGNOSIS — S99922A Unspecified injury of left foot, initial encounter: Secondary | ICD-10-CM | POA: Diagnosis not present

## 2021-11-21 DIAGNOSIS — Z7982 Long term (current) use of aspirin: Secondary | ICD-10-CM | POA: Diagnosis not present

## 2021-11-21 DIAGNOSIS — W208XXA Other cause of strike by thrown, projected or falling object, initial encounter: Secondary | ICD-10-CM | POA: Diagnosis not present

## 2021-11-21 DIAGNOSIS — S9030XA Contusion of unspecified foot, initial encounter: Secondary | ICD-10-CM

## 2021-11-21 NOTE — ED Provider Notes (Signed)
[facility] EMERGENCY DEPARTMENT Provider Note   CSN: [id number] Arrival date & time: 11/21/21  0900     History  Chief Complaint  Patient presents with   Foot Injury    Destiny Decker is a 76 y.o. female.  Patient presents with pain to the left foot since yesterday.  She states she dropped a heavy item onto the foot yesterday and has had persistent pain on the top of her left foot.  Otherwise denies any fevers or cough or vomiting or diarrhea.      Home Medications Prior to Admission medications   Medication Sig Start Date End Date Taking? Authorizing Provider  amLODipine (NORVASC) 5 MG tablet Take 5 mg by mouth every morning.     [provider]  Aspirin 81 MG EC tablet Take 81 mg by mouth daily.    [provider]  calcium carbonate (TUMS - DOSED IN MG ELEMENTAL CALCIUM) 500 MG chewable tablet Chew 1 tablet by mouth as needed.     [provider]  Cholecalciferol (VITAMIN D3) 1000 UNITS CAPS Take 1 capsule by mouth every morning.    [provider]  clindamycin (CLEOCIN) 150 MG capsule Take 1 capsule (150 mg total) by mouth 4 (four) times daily. 01/30/15   Tuchman, Richard C, DPM  diazepam (VALIUM) 5 MG tablet Take 1 tablet (5 mg total) by mouth every 6 (six) hours as needed (rectal spasms, inability to urinate). 12/28/61   Leighton Ruff, MD  docusate sodium (COLACE) 100 MG capsule Take 1 capsule (100 mg total) by mouth 2 (two) times daily. 01/03/57   Leighton Ruff, MD  erythromycin ophthalmic ointment  01/28/15   [provider]  fish oil-omega-3 fatty acids 1000 MG capsule Take 1 g by mouth 2 (two) times daily.    [provider]  ibuprofen (ADVIL,MOTRIN) 200 MG tablet Take 600 mg by mouth as needed for pain.    [provider]  levothyroxine (SYNTHROID, LEVOTHROID) 75 MCG tablet Take 75 mcg by mouth daily before breakfast.    [provider]  lisinopril (PRINIVIL,ZESTRIL) 10 MG tablet  12/29/14    [provider]  meloxicam (MOBIC) 15 MG tablet  01/22/15   [provider]  metFORMIN (GLUCOPHAGE) 500 MG tablet Take 500 mg by mouth 2 (two) times daily with a meal.    [provider]  Jonetta Speak LANCETS [address] [place]  01/15/15   [provider]  [facility] VERIO test strip  01/15/15   [provider]  oxyCODONE (OXY IR/ROXICODONE) 5 MG immediate release tablet Take 1-2 tablets (5-10 mg total) by mouth every 4 (four) hours as needed for pain. 08/05/72   Leighton Ruff, MD  pravastatin (PRAVACHOL) 40 MG tablet  01/21/15   [provider]  promethazine-codeine (PHENERGAN WITH CODEINE) 6.25-10 MG/5ML syrup  01/24/15   [provider]  Psyllium (DIETARY FIBER LAXATIVE) 28.3 % POWD Take 1 application by mouth 2 (two) times daily. 07/26/76   Leighton Ruff, MD  tiZANidine (ZANAFLEX) 2 MG tablet  11/30/14   [provider]      Allergies    Penicillins    Review of Systems   Review of Systems  Constitutional:  Negative for chills and fever.  HENT:  Negative for ear pain and sore throat.   Eyes:  Negative for pain and visual disturbance.  Respiratory:  Negative for cough and shortness of breath.   Cardiovascular:  Negative for chest pain and palpitations.  Gastrointestinal:  Negative for  abdominal pain and vomiting.  Genitourinary:  Negative for dysuria and hematuria.  Musculoskeletal:  Negative for arthralgias and back pain.  Skin:  Negative for color change and rash.  Neurological:  Negative for seizures and syncope.  All other systems reviewed and are negative.  Physical Exam Updated Vital Signs BP (!) 150/61 (BP Location: Left Arm)   Pulse 66   Temp 97.7 F (36.5 C) (Oral)   Resp 16   Ht '5\' 6"'$  (1.676 m)   Wt 67.1 kg   SpO2 98%   BMI 23.89 kg/m  Physical Exam Constitutional:      General: She is not in acute distress.    Appearance: Normal appearance.  HENT:     Head: Normocephalic.     Nose: Nose normal.  Eyes:      Extraocular Movements: Extraocular movements intact.  Cardiovascular:     Rate and Rhythm: Normal rate.  Pulmonary:     Effort: Pulmonary effort is normal.  Musculoskeletal:        General: Normal range of motion.     Cervical back: Normal range of motion.     Comments: Mild tenderness to the dorsum of the left foot.  Otherwise lower extremity is neurovascularly intact compartments are soft no cellulitis or laceration noted.  Neurological:     General: No focal deficit present.     Mental Status: She is alert. Mental status is at baseline.    ED Results / Procedures / Treatments   Labs (all labs ordered are listed, but only abnormal results are displayed) Labs Reviewed - No data to display  EKG None  Radiology DG Foot Complete Left  Result Date: 11/21/2021 CLINICAL DATA:  Left foot injury yesterday. EXAM: LEFT FOOT - COMPLETE 3+ VIEW COMPARISON:  Left foot x-rays dated July 24, 2021. FINDINGS: No acute fracture or dislocation. Joint spaces are preserved. Bone mineralization is normal. Unchanged tiny curvilinear foreign body near the plantar tip of second toe. IMPRESSION: 1. No acute osseous abnormality. 2. Unchanged tiny curvilinear foreign body near the plantar tip of the second toe. Electronically Signed   By: Titus Dubin M.D.   On: 11/21/2021 09:29    Procedures Procedures    Medications Ordered in ED Medications - No data to display  ED Course/ Medical Decision Making/ A&P                           Medical Decision Making Amount and/or Complexity of Data Reviewed Radiology: ordered.   Chart review shows office visit July 29, 2021 for heart disease.  Diagnosis is included x-rays today which were unremarkable no acute fracture noted.  Patient advised to continue Tylenol as needed for pain at home.  Advised patient follow-up with her primary care doctor in a week.  Advised return for worsening symptoms or any additional concerns.        Final  Clinical Impression(s) / ED Diagnoses Final diagnoses:  Contusion of foot, unspecified laterality, initial encounter    Rx / DC Orders ED Discharge Orders     None         Luna Fuse, MD 11/21/21 [phone]

## 2021-11-21 NOTE — ED Notes (Signed)
ED Provider at bedside. 

## 2021-11-21 NOTE — ED Triage Notes (Signed)
Pt injured left foot yesterday.  Pt has bruise on top of foot.  Pt was ambulatory to triage.

## 2021-11-21 NOTE — Discharge Instructions (Signed)
  Return immediately back to the ER if:  Your symptoms worsen within the next 12-24 hours. You develop new symptoms such as new fevers, persistent vomiting, new pain, shortness of breath, or new weakness or numbness, or if you have any other concerns.

## 2021-12-08 DIAGNOSIS — N3281 Overactive bladder: Secondary | ICD-10-CM | POA: Diagnosis not present

## 2021-12-08 DIAGNOSIS — I10 Essential (primary) hypertension: Secondary | ICD-10-CM | POA: Diagnosis not present

## 2021-12-08 DIAGNOSIS — E113291 Type 2 diabetes mellitus with mild nonproliferative diabetic retinopathy without macular edema, right eye: Secondary | ICD-10-CM | POA: Diagnosis not present

## 2021-12-08 DIAGNOSIS — Z85038 Personal history of other malignant neoplasm of large intestine: Secondary | ICD-10-CM | POA: Diagnosis not present

## 2021-12-08 DIAGNOSIS — M85859 Other specified disorders of bone density and structure, unspecified thigh: Secondary | ICD-10-CM | POA: Diagnosis not present

## 2021-12-08 DIAGNOSIS — E782 Mixed hyperlipidemia: Secondary | ICD-10-CM | POA: Diagnosis not present

## 2021-12-08 DIAGNOSIS — M150 Primary generalized (osteo)arthritis: Secondary | ICD-10-CM | POA: Diagnosis not present

## 2021-12-08 DIAGNOSIS — Z0000 Encounter for general adult medical examination without abnormal findings: Secondary | ICD-10-CM | POA: Diagnosis not present

## 2021-12-08 DIAGNOSIS — E039 Hypothyroidism, unspecified: Secondary | ICD-10-CM | POA: Diagnosis not present

## 2021-12-08 DIAGNOSIS — Z23 Encounter for immunization: Secondary | ICD-10-CM | POA: Diagnosis not present

## 2021-12-08 DIAGNOSIS — E1169 Type 2 diabetes mellitus with other specified complication: Secondary | ICD-10-CM | POA: Diagnosis not present

## 2021-12-17 DIAGNOSIS — E113212 Type 2 diabetes mellitus with mild nonproliferative diabetic retinopathy with macular edema, left eye: Secondary | ICD-10-CM | POA: Diagnosis not present

## 2021-12-17 DIAGNOSIS — E113311 Type 2 diabetes mellitus with moderate nonproliferative diabetic retinopathy with macular edema, right eye: Secondary | ICD-10-CM | POA: Diagnosis not present

## 2021-12-17 DIAGNOSIS — H3582 Retinal ischemia: Secondary | ICD-10-CM | POA: Diagnosis not present

## 2021-12-17 DIAGNOSIS — H34812 Central retinal vein occlusion, left eye, with macular edema: Secondary | ICD-10-CM | POA: Diagnosis not present

## 2021-12-29 DIAGNOSIS — K6282 Dysplasia of anus: Secondary | ICD-10-CM | POA: Diagnosis not present

## 2022-01-15 DIAGNOSIS — E113212 Type 2 diabetes mellitus with mild nonproliferative diabetic retinopathy with macular edema, left eye: Secondary | ICD-10-CM | POA: Diagnosis not present

## 2022-01-15 DIAGNOSIS — E113311 Type 2 diabetes mellitus with moderate nonproliferative diabetic retinopathy with macular edema, right eye: Secondary | ICD-10-CM | POA: Diagnosis not present

## 2022-02-03 DIAGNOSIS — E039 Hypothyroidism, unspecified: Secondary | ICD-10-CM | POA: Diagnosis not present

## 2022-02-03 DIAGNOSIS — I10 Essential (primary) hypertension: Secondary | ICD-10-CM | POA: Diagnosis not present

## 2022-02-03 DIAGNOSIS — Z955 Presence of coronary angioplasty implant and graft: Secondary | ICD-10-CM | POA: Diagnosis not present

## 2022-02-03 DIAGNOSIS — R0789 Other chest pain: Secondary | ICD-10-CM | POA: Diagnosis not present

## 2022-02-03 DIAGNOSIS — M791 Myalgia, unspecified site: Secondary | ICD-10-CM | POA: Diagnosis not present

## 2022-02-03 DIAGNOSIS — E118 Type 2 diabetes mellitus with unspecified complications: Secondary | ICD-10-CM | POA: Diagnosis not present

## 2022-02-03 DIAGNOSIS — I25118 Atherosclerotic heart disease of native coronary artery with other forms of angina pectoris: Secondary | ICD-10-CM | POA: Diagnosis not present

## 2022-02-03 DIAGNOSIS — I2510 Atherosclerotic heart disease of native coronary artery without angina pectoris: Secondary | ICD-10-CM | POA: Diagnosis not present

## 2022-02-03 DIAGNOSIS — E7800 Pure hypercholesterolemia, unspecified: Secondary | ICD-10-CM | POA: Diagnosis not present

## 2022-02-12 DIAGNOSIS — H35033 Hypertensive retinopathy, bilateral: Secondary | ICD-10-CM | POA: Diagnosis not present

## 2022-02-12 DIAGNOSIS — H34812 Central retinal vein occlusion, left eye, with macular edema: Secondary | ICD-10-CM | POA: Diagnosis not present

## 2022-02-12 DIAGNOSIS — E113212 Type 2 diabetes mellitus with mild nonproliferative diabetic retinopathy with macular edema, left eye: Secondary | ICD-10-CM | POA: Diagnosis not present

## 2022-02-12 DIAGNOSIS — H3563 Retinal hemorrhage, bilateral: Secondary | ICD-10-CM | POA: Diagnosis not present

## 2022-02-12 DIAGNOSIS — E113311 Type 2 diabetes mellitus with moderate nonproliferative diabetic retinopathy with macular edema, right eye: Secondary | ICD-10-CM | POA: Diagnosis not present

## 2022-03-12 DIAGNOSIS — H40052 Ocular hypertension, left eye: Secondary | ICD-10-CM | POA: Diagnosis not present

## 2022-03-12 DIAGNOSIS — H20042 Secondary noninfectious iridocyclitis, left eye: Secondary | ICD-10-CM | POA: Diagnosis not present

## 2022-03-12 DIAGNOSIS — E113311 Type 2 diabetes mellitus with moderate nonproliferative diabetic retinopathy with macular edema, right eye: Secondary | ICD-10-CM | POA: Diagnosis not present

## 2022-03-12 DIAGNOSIS — E113212 Type 2 diabetes mellitus with mild nonproliferative diabetic retinopathy with macular edema, left eye: Secondary | ICD-10-CM | POA: Diagnosis not present

## 2022-03-31 DIAGNOSIS — M7022 Olecranon bursitis, left elbow: Secondary | ICD-10-CM | POA: Diagnosis not present

## 2022-04-20 DIAGNOSIS — H20042 Secondary noninfectious iridocyclitis, left eye: Secondary | ICD-10-CM | POA: Diagnosis not present

## 2022-04-20 DIAGNOSIS — H34812 Central retinal vein occlusion, left eye, with macular edema: Secondary | ICD-10-CM | POA: Diagnosis not present

## 2022-04-20 DIAGNOSIS — E113311 Type 2 diabetes mellitus with moderate nonproliferative diabetic retinopathy with macular edema, right eye: Secondary | ICD-10-CM | POA: Diagnosis not present

## 2022-04-20 DIAGNOSIS — E113212 Type 2 diabetes mellitus with mild nonproliferative diabetic retinopathy with macular edema, left eye: Secondary | ICD-10-CM | POA: Diagnosis not present

## 2022-05-27 DIAGNOSIS — J069 Acute upper respiratory infection, unspecified: Secondary | ICD-10-CM | POA: Diagnosis not present

## 2022-05-28 DIAGNOSIS — E113311 Type 2 diabetes mellitus with moderate nonproliferative diabetic retinopathy with macular edema, right eye: Secondary | ICD-10-CM | POA: Diagnosis not present

## 2022-06-18 DIAGNOSIS — M85859 Other specified disorders of bone density and structure, unspecified thigh: Secondary | ICD-10-CM | POA: Diagnosis not present

## 2022-06-18 DIAGNOSIS — M150 Primary generalized (osteo)arthritis: Secondary | ICD-10-CM | POA: Diagnosis not present

## 2022-06-18 DIAGNOSIS — I10 Essential (primary) hypertension: Secondary | ICD-10-CM | POA: Diagnosis not present

## 2022-06-18 DIAGNOSIS — R2689 Other abnormalities of gait and mobility: Secondary | ICD-10-CM | POA: Diagnosis not present

## 2022-06-18 DIAGNOSIS — Z23 Encounter for immunization: Secondary | ICD-10-CM | POA: Diagnosis not present

## 2022-06-18 DIAGNOSIS — E113291 Type 2 diabetes mellitus with mild nonproliferative diabetic retinopathy without macular edema, right eye: Secondary | ICD-10-CM | POA: Diagnosis not present

## 2022-06-18 DIAGNOSIS — E782 Mixed hyperlipidemia: Secondary | ICD-10-CM | POA: Diagnosis not present

## 2022-06-18 DIAGNOSIS — E039 Hypothyroidism, unspecified: Secondary | ICD-10-CM | POA: Diagnosis not present

## 2022-06-25 DIAGNOSIS — H4052X4 Glaucoma secondary to other eye disorders, left eye, indeterminate stage: Secondary | ICD-10-CM | POA: Diagnosis not present

## 2022-06-25 DIAGNOSIS — H34812 Central retinal vein occlusion, left eye, with macular edema: Secondary | ICD-10-CM | POA: Diagnosis not present

## 2022-06-25 DIAGNOSIS — E113311 Type 2 diabetes mellitus with moderate nonproliferative diabetic retinopathy with macular edema, right eye: Secondary | ICD-10-CM | POA: Diagnosis not present

## 2022-06-25 DIAGNOSIS — H20042 Secondary noninfectious iridocyclitis, left eye: Secondary | ICD-10-CM | POA: Diagnosis not present

## 2022-07-01 IMAGING — MG MM DIGITAL SCREENING BILAT W/ TOMO AND CAD
8 series · 8 of 24 positions shown · non-contrast
Comparison: Previous exam(s).

CLINICAL DATA: Screening.

EXAM:
DIGITAL SCREENING BILATERAL MAMMOGRAM WITH TOMOSYNTHESIS AND CAD
TECHNIQUE: Bilateral screening digital craniocaudal and mediolateral oblique
mammograms were obtained. Bilateral screening digital breast
tomosynthesis was performed. The images were evaluated with
computer-aided detection.

[L CC synth-2D]
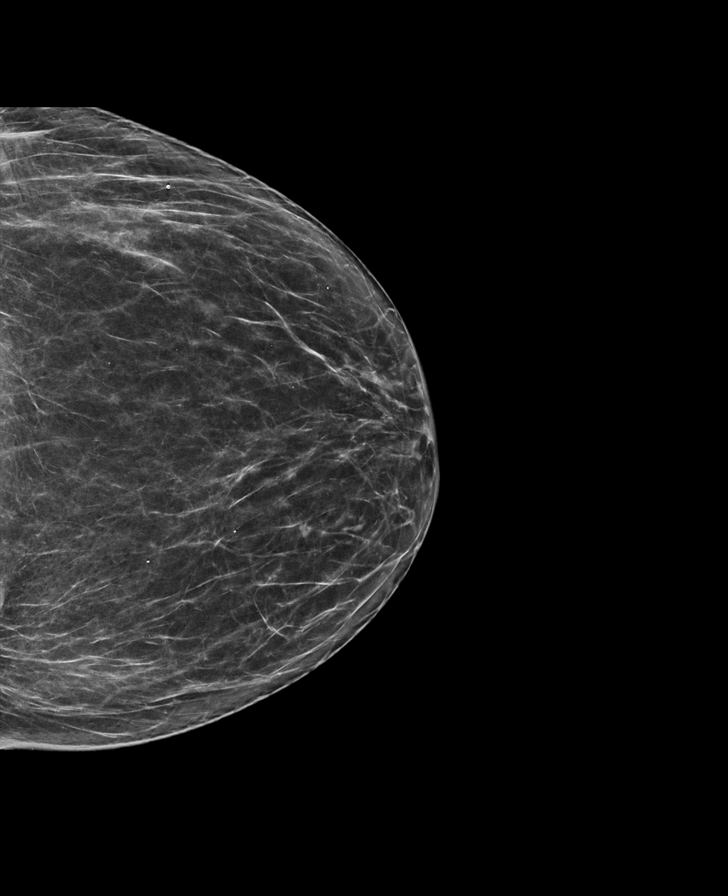

[L MLO synth-2D]
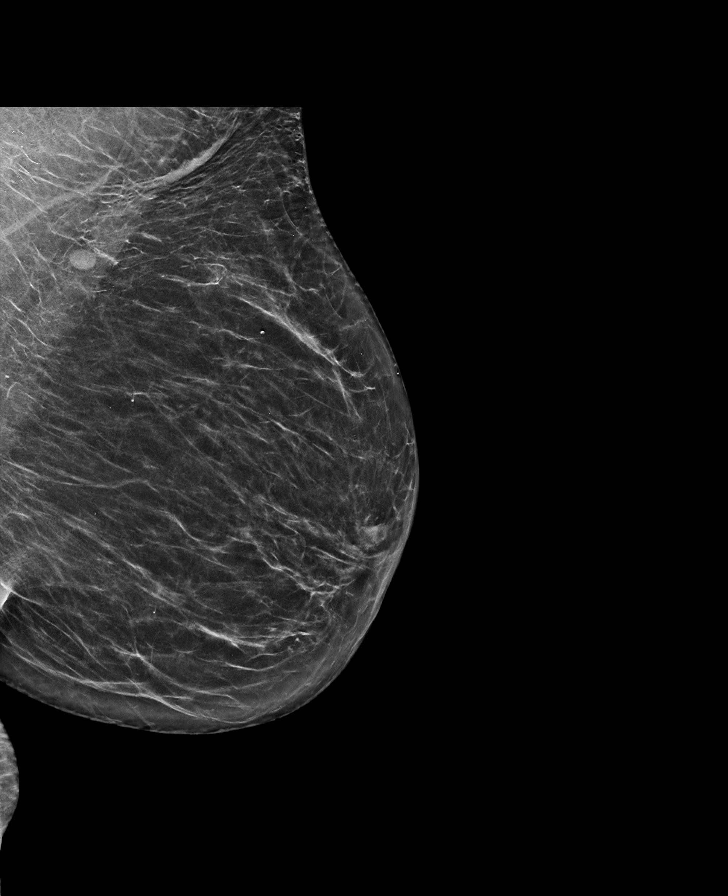

[R MLO synth-2D]
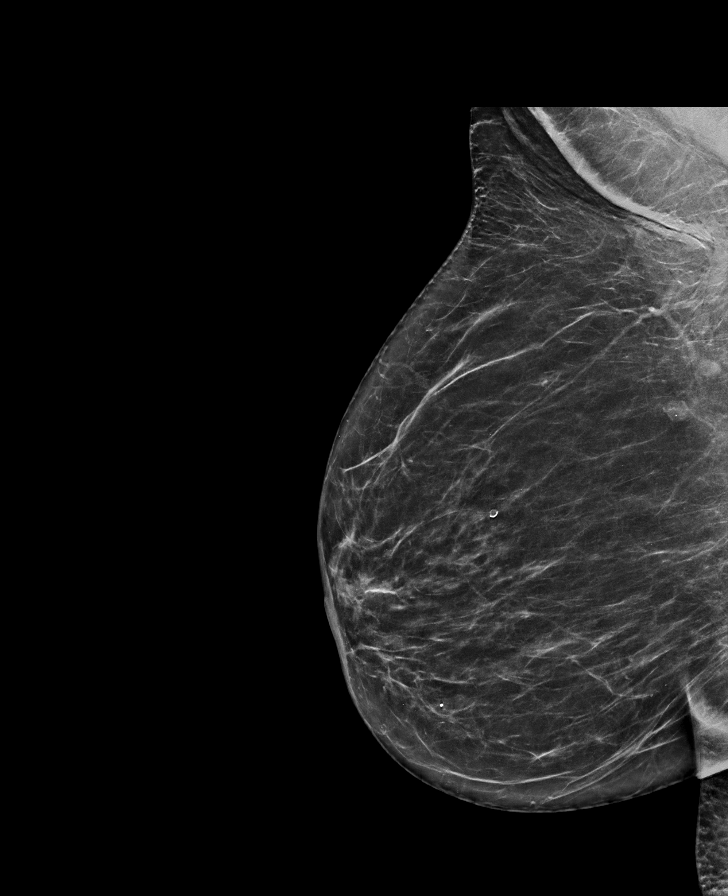

[R CC synth-2D]
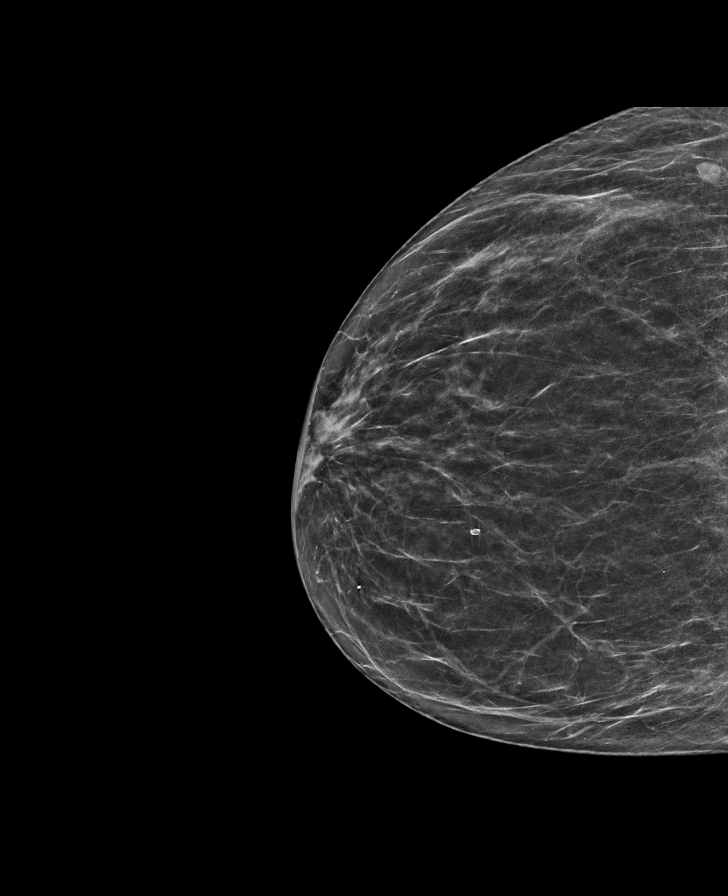

[R MLO tomo · tomo slice 37/72.0]
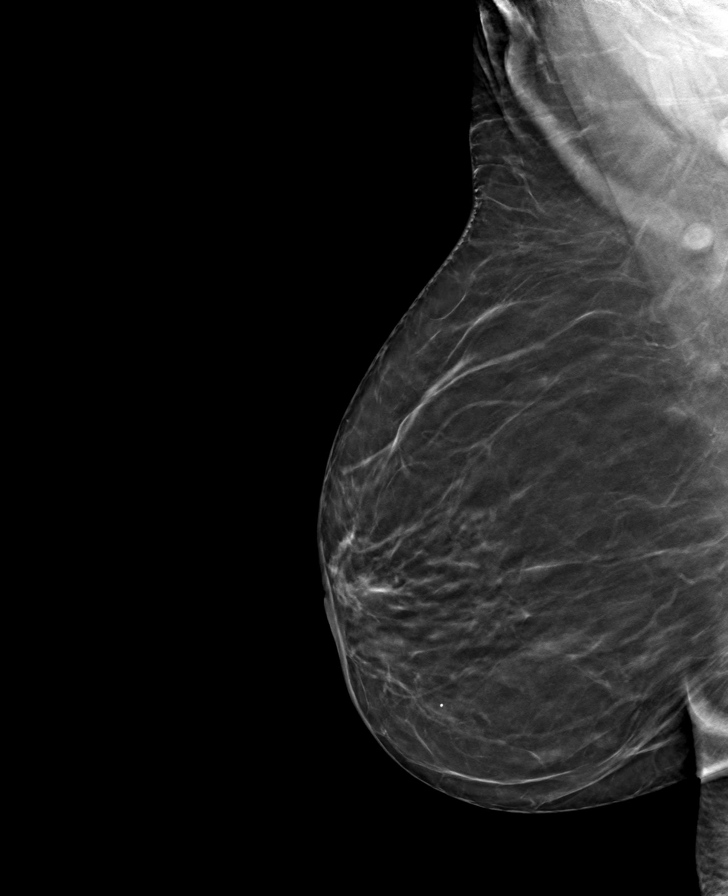

[L CC tomo · tomo slice 32/63.0]
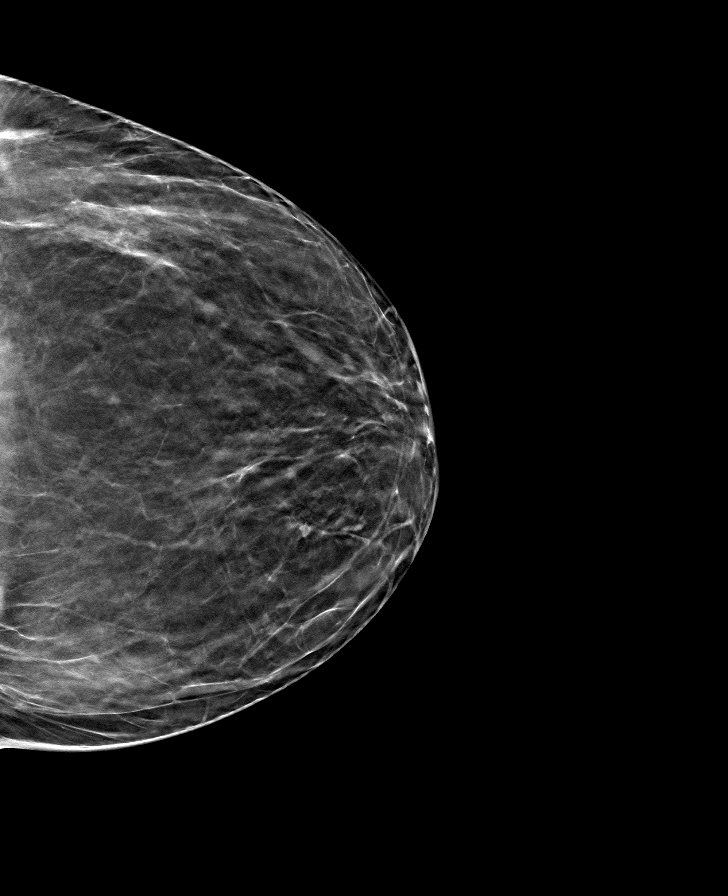

[R CC tomo · tomo slice 31/61.0]
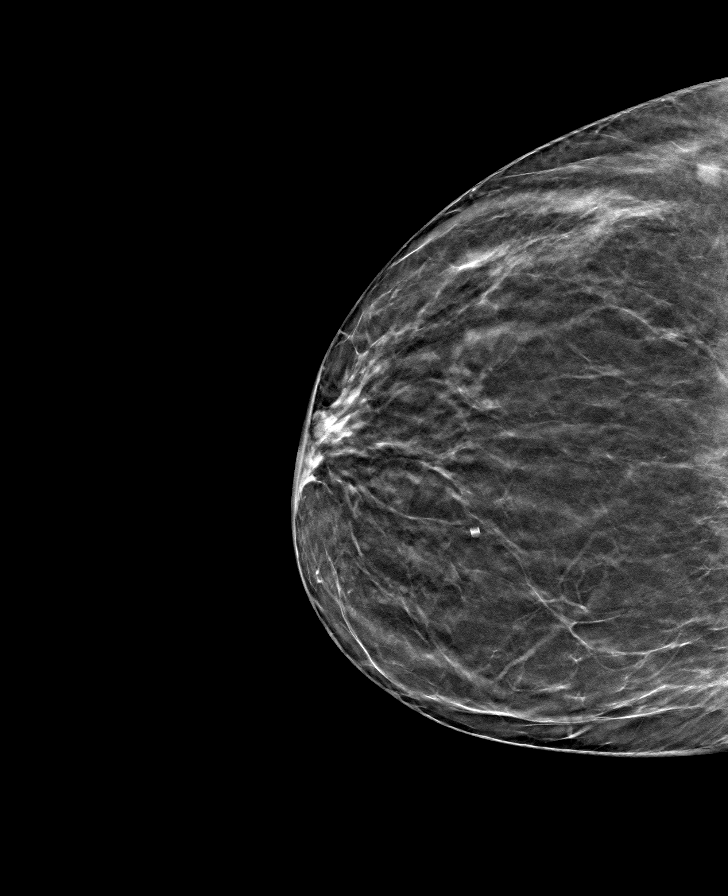

[L MLO tomo · tomo slice 37/72.0]
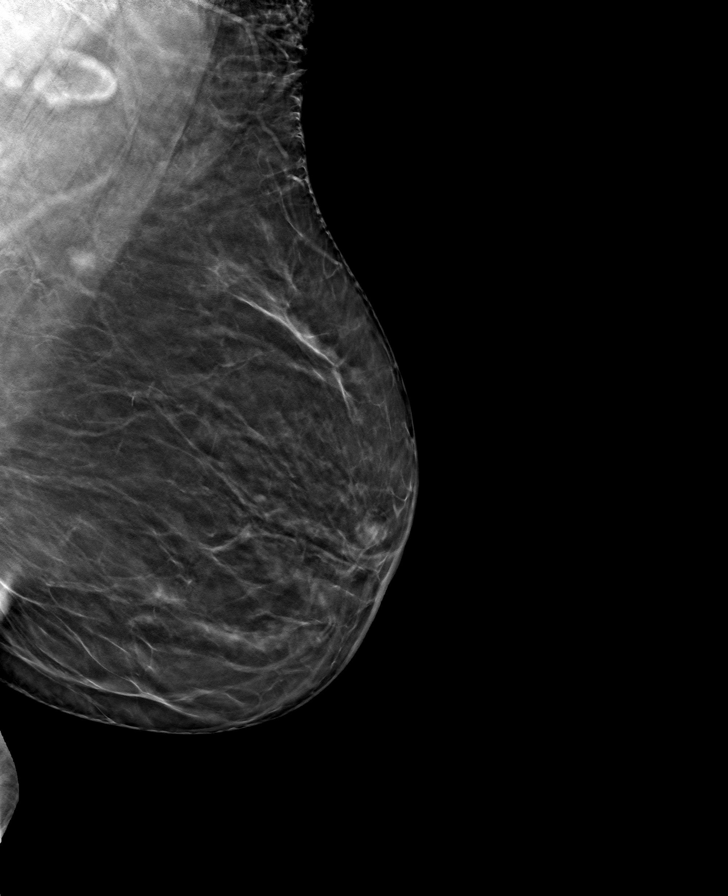

[8 of 24 positions shown; findings below may reference images not displayed]

ACR Breast Density Category b: There are scattered areas of
fibroglandular density.
FINDINGS: There are no findings suspicious for malignancy.
IMPRESSION: No mammographic evidence of malignancy. A result letter of this
screening mammogram will be mailed directly to the patient.

RECOMMENDATION:
Screening mammogram in one year. (Code:51-O-LD2)

BI-RADS CATEGORY  1: Negative.

## 2022-07-30 DIAGNOSIS — E113311 Type 2 diabetes mellitus with moderate nonproliferative diabetic retinopathy with macular edema, right eye: Secondary | ICD-10-CM | POA: Diagnosis not present

## 2022-07-30 DIAGNOSIS — E113212 Type 2 diabetes mellitus with mild nonproliferative diabetic retinopathy with macular edema, left eye: Secondary | ICD-10-CM | POA: Diagnosis not present

## 2022-08-21 DIAGNOSIS — I10 Essential (primary) hypertension: Secondary | ICD-10-CM | POA: Diagnosis not present

## 2022-08-21 DIAGNOSIS — E039 Hypothyroidism, unspecified: Secondary | ICD-10-CM | POA: Diagnosis not present

## 2022-08-21 DIAGNOSIS — I2510 Atherosclerotic heart disease of native coronary artery without angina pectoris: Secondary | ICD-10-CM | POA: Diagnosis not present

## 2022-08-21 DIAGNOSIS — R002 Palpitations: Secondary | ICD-10-CM | POA: Diagnosis not present

## 2022-08-21 DIAGNOSIS — E118 Type 2 diabetes mellitus with unspecified complications: Secondary | ICD-10-CM | POA: Diagnosis not present

## 2022-08-21 DIAGNOSIS — R0602 Shortness of breath: Secondary | ICD-10-CM | POA: Diagnosis not present

## 2022-08-21 DIAGNOSIS — E7800 Pure hypercholesterolemia, unspecified: Secondary | ICD-10-CM | POA: Diagnosis not present

## 2022-08-21 DIAGNOSIS — Z955 Presence of coronary angioplasty implant and graft: Secondary | ICD-10-CM | POA: Diagnosis not present

## 2022-08-21 DIAGNOSIS — R0609 Other forms of dyspnea: Secondary | ICD-10-CM | POA: Diagnosis not present

## 2022-08-22 DIAGNOSIS — I493 Ventricular premature depolarization: Secondary | ICD-10-CM | POA: Diagnosis not present

## 2022-09-07 DIAGNOSIS — H20042 Secondary noninfectious iridocyclitis, left eye: Secondary | ICD-10-CM | POA: Diagnosis not present

## 2022-09-07 DIAGNOSIS — E113212 Type 2 diabetes mellitus with mild nonproliferative diabetic retinopathy with macular edema, left eye: Secondary | ICD-10-CM | POA: Diagnosis not present

## 2022-09-07 DIAGNOSIS — H34812 Central retinal vein occlusion, left eye, with macular edema: Secondary | ICD-10-CM | POA: Diagnosis not present

## 2022-09-07 DIAGNOSIS — H4052X4 Glaucoma secondary to other eye disorders, left eye, indeterminate stage: Secondary | ICD-10-CM | POA: Diagnosis not present

## 2022-09-07 DIAGNOSIS — E113311 Type 2 diabetes mellitus with moderate nonproliferative diabetic retinopathy with macular edema, right eye: Secondary | ICD-10-CM | POA: Diagnosis not present

## 2022-09-07 DIAGNOSIS — T8522XA Displacement of intraocular lens, initial encounter: Secondary | ICD-10-CM | POA: Diagnosis not present

## 2022-09-11 ENCOUNTER — Other Ambulatory Visit: Payer: Self-pay | Admitting: Family Medicine

## 2022-09-11 DIAGNOSIS — Z1231 Encounter for screening mammogram for malignant neoplasm of breast: Secondary | ICD-10-CM

## 2022-10-08 DIAGNOSIS — T8522XA Displacement of intraocular lens, initial encounter: Secondary | ICD-10-CM | POA: Diagnosis not present

## 2022-10-08 DIAGNOSIS — E113311 Type 2 diabetes mellitus with moderate nonproliferative diabetic retinopathy with macular edema, right eye: Secondary | ICD-10-CM | POA: Diagnosis not present

## 2022-10-08 DIAGNOSIS — H34812 Central retinal vein occlusion, left eye, with macular edema: Secondary | ICD-10-CM | POA: Diagnosis not present

## 2022-10-08 DIAGNOSIS — H4089 Other specified glaucoma: Secondary | ICD-10-CM | POA: Diagnosis not present

## 2022-10-08 DIAGNOSIS — H20042 Secondary noninfectious iridocyclitis, left eye: Secondary | ICD-10-CM | POA: Diagnosis not present

## 2022-10-08 DIAGNOSIS — E113212 Type 2 diabetes mellitus with mild nonproliferative diabetic retinopathy with macular edema, left eye: Secondary | ICD-10-CM | POA: Diagnosis not present

## 2022-10-08 DIAGNOSIS — H4052X4 Glaucoma secondary to other eye disorders, left eye, indeterminate stage: Secondary | ICD-10-CM | POA: Diagnosis not present

## 2022-10-09 DIAGNOSIS — I361 Nonrheumatic tricuspid (valve) insufficiency: Secondary | ICD-10-CM | POA: Diagnosis not present

## 2022-10-09 DIAGNOSIS — I517 Cardiomegaly: Secondary | ICD-10-CM | POA: Diagnosis not present

## 2022-10-12 DIAGNOSIS — T8522XA Displacement of intraocular lens, initial encounter: Secondary | ICD-10-CM | POA: Diagnosis not present

## 2022-10-12 DIAGNOSIS — H20042 Secondary noninfectious iridocyclitis, left eye: Secondary | ICD-10-CM | POA: Diagnosis not present

## 2022-10-12 DIAGNOSIS — H34812 Central retinal vein occlusion, left eye, with macular edema: Secondary | ICD-10-CM | POA: Diagnosis not present

## 2022-10-12 DIAGNOSIS — E113212 Type 2 diabetes mellitus with mild nonproliferative diabetic retinopathy with macular edema, left eye: Secondary | ICD-10-CM | POA: Diagnosis not present

## 2022-10-12 DIAGNOSIS — H4089 Other specified glaucoma: Secondary | ICD-10-CM | POA: Diagnosis not present

## 2022-10-12 DIAGNOSIS — H4052X4 Glaucoma secondary to other eye disorders, left eye, indeterminate stage: Secondary | ICD-10-CM | POA: Diagnosis not present

## 2022-10-14 DIAGNOSIS — T8522XA Displacement of intraocular lens, initial encounter: Secondary | ICD-10-CM | POA: Diagnosis not present

## 2022-11-05 DIAGNOSIS — H4052X4 Glaucoma secondary to other eye disorders, left eye, indeterminate stage: Secondary | ICD-10-CM | POA: Diagnosis not present

## 2022-11-05 DIAGNOSIS — H4089 Other specified glaucoma: Secondary | ICD-10-CM | POA: Diagnosis not present

## 2022-11-05 DIAGNOSIS — Z9889 Other specified postprocedural states: Secondary | ICD-10-CM | POA: Diagnosis not present

## 2022-11-05 DIAGNOSIS — E113212 Type 2 diabetes mellitus with mild nonproliferative diabetic retinopathy with macular edema, left eye: Secondary | ICD-10-CM | POA: Diagnosis not present

## 2022-11-05 DIAGNOSIS — E113311 Type 2 diabetes mellitus with moderate nonproliferative diabetic retinopathy with macular edema, right eye: Secondary | ICD-10-CM | POA: Diagnosis not present

## 2022-11-05 DIAGNOSIS — H20042 Secondary noninfectious iridocyclitis, left eye: Secondary | ICD-10-CM | POA: Diagnosis not present

## 2022-11-05 DIAGNOSIS — T8522XA Displacement of intraocular lens, initial encounter: Secondary | ICD-10-CM | POA: Diagnosis not present

## 2022-11-05 DIAGNOSIS — H34812 Central retinal vein occlusion, left eye, with macular edema: Secondary | ICD-10-CM | POA: Diagnosis not present

## 2022-11-10 ENCOUNTER — Ambulatory Visit: Payer: Medicare PPO

## 2022-11-30 ENCOUNTER — Ambulatory Visit
Admission: RE | Admit: 2022-11-30 | Discharge: 2022-11-30 | Disposition: A | Discharge status: Home / Self Care | Payer: Medicare PPO | Source: Ambulatory Visit | Attending: Family Medicine | Admitting: Family Medicine

## 2022-11-30 DIAGNOSIS — Z1231 Encounter for screening mammogram for malignant neoplasm of breast: Secondary | ICD-10-CM

## 2022-12-17 DIAGNOSIS — E039 Hypothyroidism, unspecified: Secondary | ICD-10-CM | POA: Diagnosis not present

## 2022-12-17 DIAGNOSIS — E113291 Type 2 diabetes mellitus with mild nonproliferative diabetic retinopathy without macular edema, right eye: Secondary | ICD-10-CM | POA: Diagnosis not present

## 2022-12-17 DIAGNOSIS — M150 Primary generalized (osteo)arthritis: Secondary | ICD-10-CM | POA: Diagnosis not present

## 2022-12-17 DIAGNOSIS — E782 Mixed hyperlipidemia: Secondary | ICD-10-CM | POA: Diagnosis not present

## 2022-12-17 DIAGNOSIS — M85859 Other specified disorders of bone density and structure, unspecified thigh: Secondary | ICD-10-CM | POA: Diagnosis not present

## 2022-12-17 DIAGNOSIS — Z0000 Encounter for general adult medical examination without abnormal findings: Secondary | ICD-10-CM | POA: Diagnosis not present

## 2022-12-17 DIAGNOSIS — I10 Essential (primary) hypertension: Secondary | ICD-10-CM | POA: Diagnosis not present

## 2022-12-17 DIAGNOSIS — Z85038 Personal history of other malignant neoplasm of large intestine: Secondary | ICD-10-CM | POA: Diagnosis not present

## 2022-12-17 DIAGNOSIS — E1169 Type 2 diabetes mellitus with other specified complication: Secondary | ICD-10-CM | POA: Diagnosis not present

## 2022-12-17 DIAGNOSIS — N3281 Overactive bladder: Secondary | ICD-10-CM | POA: Diagnosis not present

## 2022-12-28 DIAGNOSIS — E113311 Type 2 diabetes mellitus with moderate nonproliferative diabetic retinopathy with macular edema, right eye: Secondary | ICD-10-CM | POA: Diagnosis not present

## 2022-12-28 DIAGNOSIS — H34812 Central retinal vein occlusion, left eye, with macular edema: Secondary | ICD-10-CM | POA: Diagnosis not present

## 2022-12-28 DIAGNOSIS — E113212 Type 2 diabetes mellitus with mild nonproliferative diabetic retinopathy with macular edema, left eye: Secondary | ICD-10-CM | POA: Diagnosis not present

## 2022-12-28 DIAGNOSIS — H4089 Other specified glaucoma: Secondary | ICD-10-CM | POA: Diagnosis not present

## 2022-12-28 DIAGNOSIS — H20042 Secondary noninfectious iridocyclitis, left eye: Secondary | ICD-10-CM | POA: Diagnosis not present

## 2022-12-28 DIAGNOSIS — Z9889 Other specified postprocedural states: Secondary | ICD-10-CM | POA: Diagnosis not present

## 2022-12-28 DIAGNOSIS — T8522XA Displacement of intraocular lens, initial encounter: Secondary | ICD-10-CM | POA: Diagnosis not present

## 2023-01-26 DIAGNOSIS — I10 Essential (primary) hypertension: Secondary | ICD-10-CM | POA: Diagnosis not present

## 2023-01-26 DIAGNOSIS — E11319 Type 2 diabetes mellitus with unspecified diabetic retinopathy without macular edema: Secondary | ICD-10-CM | POA: Diagnosis not present

## 2023-01-26 DIAGNOSIS — R300 Dysuria: Secondary | ICD-10-CM | POA: Diagnosis not present

## 2023-01-26 DIAGNOSIS — R109 Unspecified abdominal pain: Secondary | ICD-10-CM | POA: Diagnosis not present

## 2023-01-26 DIAGNOSIS — N12 Tubulo-interstitial nephritis, not specified as acute or chronic: Secondary | ICD-10-CM | POA: Diagnosis not present

## 2023-02-01 DIAGNOSIS — M2042 Other hammer toe(s) (acquired), left foot: Secondary | ICD-10-CM | POA: Diagnosis not present

## 2023-02-01 DIAGNOSIS — L84 Corns and callosities: Secondary | ICD-10-CM | POA: Diagnosis not present

## 2023-02-01 DIAGNOSIS — M79672 Pain in left foot: Secondary | ICD-10-CM | POA: Diagnosis not present

## 2023-02-01 DIAGNOSIS — L602 Onychogryphosis: Secondary | ICD-10-CM | POA: Diagnosis not present

## 2023-02-01 DIAGNOSIS — M79671 Pain in right foot: Secondary | ICD-10-CM | POA: Diagnosis not present

## 2023-02-01 DIAGNOSIS — M2041 Other hammer toe(s) (acquired), right foot: Secondary | ICD-10-CM | POA: Diagnosis not present

## 2023-02-03 DIAGNOSIS — H20042 Secondary noninfectious iridocyclitis, left eye: Secondary | ICD-10-CM | POA: Diagnosis not present

## 2023-02-03 DIAGNOSIS — T8522XA Displacement of intraocular lens, initial encounter: Secondary | ICD-10-CM | POA: Diagnosis not present

## 2023-02-03 DIAGNOSIS — E113311 Type 2 diabetes mellitus with moderate nonproliferative diabetic retinopathy with macular edema, right eye: Secondary | ICD-10-CM | POA: Diagnosis not present

## 2023-02-03 DIAGNOSIS — E113212 Type 2 diabetes mellitus with mild nonproliferative diabetic retinopathy with macular edema, left eye: Secondary | ICD-10-CM | POA: Diagnosis not present

## 2023-02-03 DIAGNOSIS — H348122 Central retinal vein occlusion, left eye, stable: Secondary | ICD-10-CM | POA: Diagnosis not present

## 2023-03-09 DIAGNOSIS — R0600 Dyspnea, unspecified: Secondary | ICD-10-CM | POA: Diagnosis not present

## 2023-03-09 DIAGNOSIS — I10 Essential (primary) hypertension: Secondary | ICD-10-CM | POA: Diagnosis not present

## 2023-03-09 DIAGNOSIS — Z955 Presence of coronary angioplasty implant and graft: Secondary | ICD-10-CM | POA: Diagnosis not present

## 2023-03-09 DIAGNOSIS — Z7901 Long term (current) use of anticoagulants: Secondary | ICD-10-CM | POA: Diagnosis not present

## 2023-03-09 DIAGNOSIS — I2510 Atherosclerotic heart disease of native coronary artery without angina pectoris: Secondary | ICD-10-CM | POA: Diagnosis not present

## 2023-03-09 DIAGNOSIS — R001 Bradycardia, unspecified: Secondary | ICD-10-CM | POA: Diagnosis not present

## 2023-03-09 DIAGNOSIS — E7800 Pure hypercholesterolemia, unspecified: Secondary | ICD-10-CM | POA: Diagnosis not present

## 2023-03-09 DIAGNOSIS — Z7982 Long term (current) use of aspirin: Secondary | ICD-10-CM | POA: Diagnosis not present

## 2023-03-09 DIAGNOSIS — T447X5A Adverse effect of beta-adrenoreceptor antagonists, initial encounter: Secondary | ICD-10-CM | POA: Diagnosis not present

## 2023-03-10 ENCOUNTER — Other Ambulatory Visit: Payer: Self-pay | Admitting: Family Medicine

## 2023-03-10 DIAGNOSIS — R1033 Periumbilical pain: Secondary | ICD-10-CM

## 2023-03-11 DIAGNOSIS — E113212 Type 2 diabetes mellitus with mild nonproliferative diabetic retinopathy with macular edema, left eye: Secondary | ICD-10-CM | POA: Diagnosis not present

## 2023-03-11 DIAGNOSIS — E113311 Type 2 diabetes mellitus with moderate nonproliferative diabetic retinopathy with macular edema, right eye: Secondary | ICD-10-CM | POA: Diagnosis not present

## 2023-03-12 DIAGNOSIS — R1033 Periumbilical pain: Secondary | ICD-10-CM | POA: Diagnosis not present

## 2023-04-15 DIAGNOSIS — E113311 Type 2 diabetes mellitus with moderate nonproliferative diabetic retinopathy with macular edema, right eye: Secondary | ICD-10-CM | POA: Diagnosis not present

## 2023-04-15 DIAGNOSIS — H20042 Secondary noninfectious iridocyclitis, left eye: Secondary | ICD-10-CM | POA: Diagnosis not present

## 2023-04-15 DIAGNOSIS — E113212 Type 2 diabetes mellitus with mild nonproliferative diabetic retinopathy with macular edema, left eye: Secondary | ICD-10-CM | POA: Diagnosis not present

## 2023-04-15 DIAGNOSIS — H35033 Hypertensive retinopathy, bilateral: Secondary | ICD-10-CM | POA: Diagnosis not present

## 2023-04-15 DIAGNOSIS — H348122 Central retinal vein occlusion, left eye, stable: Secondary | ICD-10-CM | POA: Diagnosis not present

## 2023-05-20 DIAGNOSIS — H35033 Hypertensive retinopathy, bilateral: Secondary | ICD-10-CM | POA: Diagnosis not present

## 2023-05-20 DIAGNOSIS — H20042 Secondary noninfectious iridocyclitis, left eye: Secondary | ICD-10-CM | POA: Diagnosis not present

## 2023-05-20 DIAGNOSIS — E113212 Type 2 diabetes mellitus with mild nonproliferative diabetic retinopathy with macular edema, left eye: Secondary | ICD-10-CM | POA: Diagnosis not present

## 2023-05-20 DIAGNOSIS — E113311 Type 2 diabetes mellitus with moderate nonproliferative diabetic retinopathy with macular edema, right eye: Secondary | ICD-10-CM | POA: Diagnosis not present

## 2023-05-20 DIAGNOSIS — H348122 Central retinal vein occlusion, left eye, stable: Secondary | ICD-10-CM | POA: Diagnosis not present

## 2023-06-01 DIAGNOSIS — E11319 Type 2 diabetes mellitus with unspecified diabetic retinopathy without macular edema: Secondary | ICD-10-CM | POA: Diagnosis not present

## 2023-06-01 DIAGNOSIS — E039 Hypothyroidism, unspecified: Secondary | ICD-10-CM | POA: Diagnosis not present

## 2023-06-01 DIAGNOSIS — E782 Mixed hyperlipidemia: Secondary | ICD-10-CM | POA: Diagnosis not present

## 2023-06-01 DIAGNOSIS — E118 Type 2 diabetes mellitus with unspecified complications: Secondary | ICD-10-CM | POA: Diagnosis not present

## 2023-06-01 DIAGNOSIS — Z23 Encounter for immunization: Secondary | ICD-10-CM | POA: Diagnosis not present

## 2023-06-01 DIAGNOSIS — I10 Essential (primary) hypertension: Secondary | ICD-10-CM | POA: Diagnosis not present

## 2023-06-24 DIAGNOSIS — E113311 Type 2 diabetes mellitus with moderate nonproliferative diabetic retinopathy with macular edema, right eye: Secondary | ICD-10-CM | POA: Diagnosis not present

## 2023-06-24 DIAGNOSIS — E113212 Type 2 diabetes mellitus with mild nonproliferative diabetic retinopathy with macular edema, left eye: Secondary | ICD-10-CM | POA: Diagnosis not present

## 2023-06-29 DIAGNOSIS — J988 Other specified respiratory disorders: Secondary | ICD-10-CM | POA: Diagnosis not present

## 2023-12-02 ENCOUNTER — Other Ambulatory Visit: Payer: Self-pay | Admitting: Family Medicine

## 2023-12-02 DIAGNOSIS — Z1231 Encounter for screening mammogram for malignant neoplasm of breast: Secondary | ICD-10-CM

## 2023-12-06 ENCOUNTER — Ambulatory Visit
Admission: RE | Admit: 2023-12-06 | Discharge: 2023-12-06 | Disposition: A | Discharge status: Home / Self Care | Source: Ambulatory Visit | Attending: Family Medicine | Admitting: Family Medicine

## 2023-12-06 DIAGNOSIS — Z1231 Encounter for screening mammogram for malignant neoplasm of breast: Secondary | ICD-10-CM

## 2024-03-10 DIAGNOSIS — E039 Hypothyroidism, unspecified: Secondary | ICD-10-CM | POA: Diagnosis not present

## 2024-03-10 DIAGNOSIS — I10 Essential (primary) hypertension: Secondary | ICD-10-CM | POA: Diagnosis not present

## 2024-03-10 DIAGNOSIS — R252 Cramp and spasm: Secondary | ICD-10-CM | POA: Diagnosis not present

## 2024-03-10 DIAGNOSIS — Z7984 Long term (current) use of oral hypoglycemic drugs: Secondary | ICD-10-CM | POA: Diagnosis not present

## 2024-03-10 DIAGNOSIS — E7800 Pure hypercholesterolemia, unspecified: Secondary | ICD-10-CM | POA: Diagnosis not present

## 2024-03-10 DIAGNOSIS — E118 Type 2 diabetes mellitus with unspecified complications: Secondary | ICD-10-CM | POA: Diagnosis not present

## 2024-03-10 DIAGNOSIS — H6121 Impacted cerumen, right ear: Secondary | ICD-10-CM | POA: Diagnosis not present

## 2024-03-10 DIAGNOSIS — K5900 Constipation, unspecified: Secondary | ICD-10-CM | POA: Diagnosis not present

## 2024-03-14 DIAGNOSIS — I10 Essential (primary) hypertension: Secondary | ICD-10-CM | POA: Diagnosis not present

## 2024-03-14 DIAGNOSIS — E039 Hypothyroidism, unspecified: Secondary | ICD-10-CM | POA: Diagnosis not present

## 2024-03-14 DIAGNOSIS — E118 Type 2 diabetes mellitus with unspecified complications: Secondary | ICD-10-CM | POA: Diagnosis not present

## 2024-03-14 DIAGNOSIS — Z955 Presence of coronary angioplasty implant and graft: Secondary | ICD-10-CM | POA: Diagnosis not present

## 2024-03-14 DIAGNOSIS — R0789 Other chest pain: Secondary | ICD-10-CM | POA: Diagnosis not present

## 2024-03-14 DIAGNOSIS — I2510 Atherosclerotic heart disease of native coronary artery without angina pectoris: Secondary | ICD-10-CM | POA: Diagnosis not present

## 2024-03-14 DIAGNOSIS — I498 Other specified cardiac arrhythmias: Secondary | ICD-10-CM | POA: Diagnosis not present

## 2024-03-14 DIAGNOSIS — R001 Bradycardia, unspecified: Secondary | ICD-10-CM | POA: Diagnosis not present

## 2024-03-14 DIAGNOSIS — E7800 Pure hypercholesterolemia, unspecified: Secondary | ICD-10-CM | POA: Diagnosis not present

## 2024-03-29 DIAGNOSIS — H348122 Central retinal vein occlusion, left eye, stable: Secondary | ICD-10-CM | POA: Diagnosis not present

## 2024-03-29 DIAGNOSIS — E113212 Type 2 diabetes mellitus with mild nonproliferative diabetic retinopathy with macular edema, left eye: Secondary | ICD-10-CM | POA: Diagnosis not present

## 2024-03-29 DIAGNOSIS — E113311 Type 2 diabetes mellitus with moderate nonproliferative diabetic retinopathy with macular edema, right eye: Secondary | ICD-10-CM | POA: Diagnosis not present

## 2024-03-29 DIAGNOSIS — H35033 Hypertensive retinopathy, bilateral: Secondary | ICD-10-CM | POA: Diagnosis not present

## 2024-03-29 DIAGNOSIS — H20042 Secondary noninfectious iridocyclitis, left eye: Secondary | ICD-10-CM | POA: Diagnosis not present

## 2024-04-10 DIAGNOSIS — Z23 Encounter for immunization: Secondary | ICD-10-CM | POA: Diagnosis not present

## 2024-04-10 DIAGNOSIS — E118 Type 2 diabetes mellitus with unspecified complications: Secondary | ICD-10-CM | POA: Diagnosis not present

## 2024-04-10 DIAGNOSIS — I10 Essential (primary) hypertension: Secondary | ICD-10-CM | POA: Diagnosis not present

## 2024-04-17 DIAGNOSIS — I491 Atrial premature depolarization: Secondary | ICD-10-CM | POA: Diagnosis not present

## 2024-04-17 DIAGNOSIS — I493 Ventricular premature depolarization: Secondary | ICD-10-CM | POA: Diagnosis not present

## 2024-04-17 DIAGNOSIS — I471 Supraventricular tachycardia, unspecified: Secondary | ICD-10-CM | POA: Diagnosis not present

## 2024-04-17 DIAGNOSIS — R001 Bradycardia, unspecified: Secondary | ICD-10-CM | POA: Diagnosis not present

## 2024-05-15 DIAGNOSIS — E113311 Type 2 diabetes mellitus with moderate nonproliferative diabetic retinopathy with macular edema, right eye: Secondary | ICD-10-CM | POA: Diagnosis not present

## 2024-06-15 NOTE — Progress Notes (Signed)
 "   [address] - AMBULATORY [facility]  - [facility] MEDICINE [place] [phone] [place] [address]   June 15, 2024  Patient ID: Destiny Decker is a 78 y.o. female   Assessment/Plan:    Destiny Decker was seen today for medicare annual wellness visit subsequent, diabetes, hyperlipidemia, hypertension and hypothyroidism.  Diagnoses and all orders for this visit:  Encounter for Medicare annual wellness exam Overall health good Colon Cancer Screening: up to date Breast Cancer Screening: Up-to-date Cervical cancer Screening: Aged out Bone Density: DEXA scan ordered Vaccines: TdaP, Pneumonia, Shingles. Educated about seasonal vaccines  Counseling: exercise, diet, sun exposure/skin protection and weight loss   Essential hypertension Chronic, well-controlled.  Continue current management.  Work on low-salt diet.  Orders: -     Comprehensive Metabolic Panel; Future -     CBC with Differential; Future  Type 2 diabetes mellitus with complication, without long-term current use of insulin  (HCC) Chronic, suspect slightly above goal.  She is tolerating Jardiance well.  Continue current management.  Check A1c.  Orders: -     Comprehensive Metabolic Panel; Future -     Hemoglobin A1C With Estimated Average Glucose; Future  Acquired hypothyroidism Stable on current treatment. Continue current dose of Synthroid .  Check TSH and adjust medication pending results.  Levels to be checked every 6 to 12 months or sooner if symptoms change.  Review and reinforce adherence to medication (take on empty stomach, avoid calcium /iron supplements within 4 hours).  Orders: -     TSH With Reflex To Free T4; Future  Hypercholesterolemia Chronic, stable. ASCVD risk reviewed. Continue current management. Educated on lifestyle modifications with heart healthy diet, exercise and weight loss. Check Lipid Panel today.  Orders: -     Lipid Panel With Reflex Direct LDL;  Future  Need for hepatitis C screening test -     Hepatitis C Virus (HCV) Antibody Screen With Confirmation; Future  Vaginal discharge -     fluconazole (DIFLUCAN) 150 mg tablet; Take 1 tablet (150 mg total) by mouth once for 1 dose.  Screening for osteoporosis -     DEXA Scan 1 or More Sites Axial; Future    Patient verbalizes understanding and in agreement with the above plan. All questions answered.  Medication side effects discussed with patient. Advised patient to call clinic or return for visit if symptoms occur. Goals of care discussed with patient including med compliance and adequate follow up.  Return in 3 months (on 09/13/2024) for Chronic conditions .  Subjective:   Chief Complaint  Patient presents with   Medicare Annual Wellness Visit Subsequent   Diabetes   Hyperlipidemia   Hypertension   Hypothyroidism     HPI: Destiny Decker presents today for his annual wellness and follow up in chronic conditions.  Since last visit she has been well, denies ER visits, falls or new medications.  Health care maintenance Mammogram: 12/06/23 Colonoscopy: never Tobacco: No ETOH: No Bone Density: 10/15/21 Skin: Self skin checks. Wear sunscreen.  Dental: Cleaning every 6 months.  Eye Exam: yearly  Hearing concerns: No  Regarding DM  Patient was last seen 04/10/24 Currently on Jardiance 10mg  daily, Metformin 1000mg  bid and is taking medications as prescribed  Fasting glucose ranging not checking Patient is tolerating medications well.  Patient is not exercising  Patient  does follow low carb diet  Denies hypoglycemia Last A1C: 7.7 Denies any new symptoms such as vision changes, neuropathic pain, polydipsia  or polyuria.   Regarding HTN  Currently on Amlodipine  10mg  daily, Imdur 30mg  daily, Losartan  50mg  daily, Metoprolol 12.5mg  daily  and is taking medications as prescribed  Patient is tolerating medications well.  Patient is not exercising  Patient  does follow low  salt diet.  Patient is not checking blood pressure daily. Patient denies chest pain, sob, nausea, vomiting, dizziness, cough, abdominal pain, headache.  Regarding hypothyroidism  Patient is taking medications as prescribed. Symptoms include: Weight loss: No Poor sleep: {No Fatigue: No Vision changes: No Heat intolerance: No Increased sweating: No Palpitations: No Tremor: No Changes in menstruation: N/A Loss of hair: No Weight gain: No Cold intolerance: Yes Muscle aches: Yes Depression: No  Regarding hyperlipidemia. Patient taking Crestor  20mg  daily as prescribed. Reports aching to muscles.  Vaginal discharge yellow/white, started last week. Itchiness.    Immunizations by Immunization Family     Covid-19 Vaccine Unspecified 07/19/2019 (78 y.o.) 08/09/2019 (78 y.o.) 05/31/2020 (78 y.o.) 01/10/2021 (78 y.o.)   Influenza, High-dose Seasonal, Quadrivalent, Preservative Free 04/01/2019 (78 y.o.) 04/06/2020 (78 y.o.) 05/08/2021 (78 y.o.) 06/18/2022 (78 y.o.)   Influenza, high-dose, trivalent, PF 06/01/2023 (77 y.o.) 04/10/2024 (78 y.o.)     Influenza, split virus, trivalent, preservative 03/29/2012 (78 y.o.) 03/29/2013 (78 y.o.)     Pneumococcal Conjugate 13-Valent 02/16/2014 (78 y.o.)      Pneumococcal Polysaccharide Vaccine, 23 Valent (PNEUMOVAX-23) 2Y+ 01/30/2010 (78 y.o.)      TDAP VACCINE (BOOSTRIX,ADACEL) 7Y+ 02/02/2011 (78 y.o.) 12/08/2021 (78 y.o.)     Td, Not Adsorbed 06/29/2005 (78 y.o.)      Zoster, Live 08/04/2012 (78 y.o.) 12/05/2019 (78 y.o.)         ROS   A complete ROS was performed with pertinent positives/negatives noted in the HPI. The remainder of the ROS are negative.    Physical Exam    Vital Signs  Vitals:   06/15/24 0957  BP: 118/70  BP Location: Left arm  Patient Position: Sitting  Pulse: 59  Resp: 18  SpO2: 96%  Weight: 64.4 kg (142 lb)  Height: 1.676 m (5' 6)    Constitutional: Well-developed and well-nourished. Sitting comfortably conversing normally.  Pleasant.  Eyes: Conjunctivae clear and pink, no pallor, EOM intact. Pupils are equal, round, and reactive to light. Anicteric. Ears, Mouth, Nose:  External and internal auditory canals are normal. Tympanic membranes with normal light reflex without erythema. Normal nasal mucosa.  Oropharynx is normal, no erythema or exudates, uvula midline.  Lymphatics: There is no palpable cervical or supraclavicular adenopathy.  Neck: Supple. Non-tender thyroid , no thyromegaly. No JVD. Cardiovascular: +S1S2, regular rate and rhythm, no murmurs, gallops or rubs appreciated.  Respiratory: Normal effort, clear to auscultation bilaterally. No wheezes, rales or rhonchi noted.  Gastrointestinal: Bowel sounds present and normal. Soft and nontender to palpation. There is no hepatosplenomegaly. No rebound, guarding or fluid wave. Musculoskeletal:  Upper and Lower extremities are symmetric with grossly normal muscle strength and tone with normal range of motion. Extremities: No cyanosis, clubbing or edema. Pulses 2+ all 4 extremities. Back: No CVA tenderness bilaterally. Neuro: Cranial nerves II-XII grossly intact. Normal sensation and motor of all extremities.  Skin: Skin is warm and dry. No rash noted. Psychiatric: Behavior is Cooperative and Polite. Mood euthymyic. Affect is appropriate.    Medical History    There are no preventive care reminders to display for this patient.  Medications Current Medications[1]  Allergies Penicillins  Medical History Problem List[2]  Surgical History Surgical History[3]  Social History Social History  Socioeconomic History   Marital status: Married    Spouse name: Not on file   Number of children: Not on file   Years of education: Not on file   Highest education level: Not on file  Occupational History   Not on file  Tobacco Use   Smoking status: Never   Smokeless tobacco: Never  Substance and Sexual Activity   Alcohol use: Yes    Alcohol/week:  1.0 standard drink of alcohol   Drug use: No   Sexual activity: Not on file  Other Topics Concern   Not on file  Social History Narrative   Not on file   Social Drivers of Health   Living Situation: Low Risk (03/14/2024)   Living Situation    What is your living situation today?: I have a steady place to live    Think about the place you live. Do you have problems with any of the following? Choose all that apply:: Not on file  Food Insecurity: Low Risk (03/14/2024)   Food vital sign    Within the past 12 months, you worried that your food would run out before you got money to buy more: Never true    Within the past 12 months, the food you bought just didn't last and you didn't have money to get more: Never true  Transportation Needs: Not on file  Utilities: Not on file  Safety: Low Risk (03/14/2024)   Safety    How often does anyone, including family and friends, physically hurt you?: Never    How often does anyone, including family and friends, insult or talk down to you?: Not on file    How often does anyone, including family and friends, threaten you with harm?: Not on file    How often does anyone, including family and friends, scream or curse at you?: Not on file  Alcohol Screening: Not At Risk (06/15/2024)   Alcohol    Audit C Alcohol risk score: 0  Tobacco Use: Low Risk (06/15/2024)   Patient History    Smoking Tobacco Use: Never    Smokeless Tobacco Use: Never    Passive Exposure: Not on file  Depression: Not At Risk (06/15/2024)   PHQ-2    PHQ-2 Score: 0  Social Connections: Unknown (11/11/2021)   Received from [facility]   Social Network    Social Network: Not on file  Financial Resource Strain: Not on file    Family History Family History[4]   This document serves as a record of services personally performed by Dr. Nikki.  It was created on their behalf by Delon Macario Fairly, LPN, a trained medical scribe, and Licensed Practical Nurse  (LPN). During the course of documenting the history, physical exam and medical decision making, I was functioning as a stage manager. The creation of this record is the provider's dictation and/or activities during the visit.  Electronically signed by Delon Macario Fairly, LPN 87/81/7974 2:95 AM  I agree the documentation is accurate and complete.  Aliene Nikki, MD  Note - This document was created using the aid of voice recognition Dragon dictation software.    [facility]  - [facility] MEDICINE [place] Medicare Wellness Visit Type:: Subsequent Annual Wellness Visit  Name: Destiny Decker Date of Birth: [birth year] Age: 77 y.o. MRN: [id number] Visit Date: 06/15/2024  History obtained from: patient  Living Arrangements/Support System/Health Assessment/Pain/Stress Marital status: married Number of children: 2 Occupation: retired Living arrangements: lives with spouse/significant other Does the patient  have a support system (family, friend, church, conservation officer, nature, etc)?: Yes   Do you have any dental concerns?: No In the past month, have you experienced a change in your bladder control?: No   Do you have any difficulty obtaining your medications?: No   Do you have trouble consistently taking or remembering to take all of your medications as prescribed?: No Patient rates overall stress level as: None Does stress affect daily life?: No Typical amount of pain: some Does pain affect daily life?: No Are you currently prescribed opioids?: No                Depression Screening  Behavioral Health Screening  Patient Health Questionnaire-2 Score: 0 (06/15/2024  9:47 AM)      Patient's Depression screening/score = Negative      Social History (Tobacco/Drugs/Sexual Activity) Destiny Decker reports that she has never smoked. She has never used smokeless tobacco. Tobacco Use?: No How many times in the past year have you used a recreational drug or used a prescription  medication for nonmedical reasons?: None Risk factors for sexually transmitted infections (i.e., multiple sexual partners): No Are you bothered by sexual problems?: No  Alcohol Screening How often do you have a drink containing alcohol?: Never How many standard drinks containing alcohol do you have on a typical day?: Never, 1 or 2 drinks How often do you have six or more drinks on one occasion?: Never Audit-C Score: 0  Physical Activity Regular exercise?: (!) No      Diet How many meals a day?: 3 Eats fruit and vegetables daily?: Yes Most meals are obtained by: preparing their own meals  Home and Transportation Safety All rugs have non-skid backing?: Yes All stairs or steps have railings?: Yes Grab bars in the bathtub or shower?: Yes Have non-skid surface in bathtub or shower?: Yes Good home lighting?: Yes Regular seat belt use?: Yes      Activities of Daily Living Feed self?: Yes Bathe self?: Yes Dress self?: Yes Use toilet without assistance?: Yes Walk without assistance?: Yes    Instrumental Activities of Daily Living Manage finances?: Yes Shop for themselves?: Yes Prepare meals?: Yes Use the telephone?: Yes Manage medications?: Yes   Performs basic housework/laundry?: Yes Drives?: Yes Primary transportation is: driving  Hearing Concerns about hearing?: No Uses hearing aids?: No Hear whispered voice? (Observed): Yes  Vision Concerns about vision?: No Vision exam performed?: Yes  Fall Risk Is the patient ambulatory?: Yes One or more falls in the last year:: No Feels unsteady when walking:: No  Cognitive Assessment Has a diagnosis of dementia or cognitive impairment?: No Are there any memory concerns by the patient, others, or providers?: No              Advance Directives Living will?: (!) No Advance directive information provided to patient: Yes Healthcare POA?: (!) No       Who is your in case of emergency contact?: Chalice Philbert Relationship to patient: Spouse Emergency contact's phone number: [phone]   Other History I reviewed and updated the following risk factors and conditions as appropriate: Reviewed/Updated: Problem List, Medical History, Surgical History, Family History, Medications, Allergies Reviewed/Updated: Vital Signs (height, weight, and BP), Immunizations, Health Maintenance Patient Care Team Updated: Done  Vital Signs BP 118/70 (BP Location: Left arm, Patient Position: Sitting)   Pulse 59   Resp 18   Ht 1.676 m (5' 6)   Wt 64.4 kg (142 lb)   SpO2 96%   BMI 22.92 kg/m  Screening and Immunizations Health Maintenance Status       Date Due Completion Dates   Bone Density Scan Never done ---   Diabetes: Foot Exam Never done ---   Hepatitis C Screening Never done ---   Adult RSV (50+ Years or Pregnancy) (1 - 1-dose 75+ series) Never done ---   ZOSTER VACCINE (2 of 3) 01/30/2020 12/05/2019, 08/04/2012   COVID-19 Vaccine (5 - 2025-26 season) 02/28/2024 01/10/2021, 05/31/2020   Diabetes:  eGFR for Kidney Evaluation 03/10/2025 03/10/2024, 08/21/2022   Diabetes:  Quantitative uACR for Kidney Evaluation 03/10/2025 03/10/2024   Diabetes: Hemoglobin A1C 03/10/2025 03/10/2024, 03/10/2024   Comprehensive Annual Visit 06/15/2025 06/15/2024   Depression Screening 06/15/2025 06/15/2024   DTaP/Tdap/Td Vaccines (3 - Td or Tdap) 12/09/2031 12/08/2021, 02/02/2011       Immunization History  Administered Date(s) Administered   Covid-19 Vaccine Unspecified 01/10/2021   Influenza, High-dose Seasonal, Quadrivalent, Preservative Free 04/01/2019, 04/06/2020, 05/08/2021, 06/18/2022   Influenza, high-dose, trivalent, PF 06/01/2023, 04/10/2024   Influenza, split virus, trivalent, preservative 03/29/2012, 03/29/2013   Pfizer SARS-CoV-2 Primary Series 12+ yrs 07/19/2019, 08/09/2019, 05/31/2020   Pneumococcal Conjugate 13-Valent 02/16/2014   Pneumococcal Polysaccharide Vaccine, 23 Valent (PNEUMOVAX-23) 2Y+  01/30/2010   TDAP VACCINE (BOOSTRIX,ADACEL) 7Y+ 02/02/2011, 12/08/2021   Td, Not Adsorbed 06/29/2005   Zoster, Live 08/04/2012, 12/05/2019    Assessment/Plan: Subsequent Annual Wellness Visit: The topics above were reviewed with the patient.  Healthy lifestyle principles reviewed.  Recommendations provided when indicated.  Follow up 1 year for next wellness visit.  Orders Placed This Encounter  Procedures   DEXA Scan 1 or More Sites Axial   Lipid Panel With Reflex Direct LDL   Comprehensive Metabolic Panel   CBC with Differential   TSH With Reflex To Free T4   Hemoglobin A1C With Estimated Average Glucose   Hepatitis C Virus (HCV) Antibody Screen With Confirmation    New Medications Ordered This Visit  Medications   fluconazole (DIFLUCAN) 150 mg tablet    Sig: Take 1 tablet (150 mg total) by mouth once for 1 dose.    Dispense:  1 tablet    Refill:  0    Patient Care Team: Aliene Nikki Rams, MD as PCP - General (Internal Medicine) Aliene Nikki Rams, MD as PCP - Attributed  Electronically signed by: Aliene Nikki, MD 06/15/2024 5:21 PM       [1] Current Outpatient Medications  Medication Sig Dispense Refill   amLODIPine  (NORVASC ) 10 mg tablet Take 10 mg by mouth Once Daily.     ascorbic acid (Vitamin C) 100 mg tablet Take 100 mg by mouth Once Daily.     aspirin  81 mg EC tablet Take 81 mg by mouth Once Daily.     calcium  carbonate (OS-CAL) 1500 mg (600 mg calcium ) tablet Take 1 tablet by mouth 2 (two) times a day. with meals     cholecalciferol  25 mcg (1,000 unit) cap Take 1 capsule by mouth Once Daily.     clopidogreL (PLAVIX) 75 mg tablet Take 1 tablet by mouth once daily 90 tablet 0   docosahexaenoic acid-epa 120-180 mg cap Take 1 g by mouth 2 (two) times a day.     empagliflozin (JARDIANCE) 10 mg tab Take 1 tablet (10 mg total) by mouth daily. 90 tablet 1   isosorbide mononitrate (IMDUR) 30 mg 24 hr tablet Take 1 tablet by mouth once daily 90  tablet 3   levothyroxine  (SYNTHROID ) 75 mcg tablet Take 1 tablet (75 mcg total) by mouth  daily. 90 tablet 1   losartan  (COZAAR ) 50 mg tablet Take 1 tablet (50 mg total) by mouth daily. 90 tablet 3   magnesium 30 mg tab Take 30 mg by mouth 2 (two) times a day.     meloxicam (MOBIC) 15 mg tablet Take 1 tablet by mouth once daily 30 tablet 0   metFORMIN (GLUCOPHAGE) 1,000 mg tablet Take 1 tablet (1,000 mg total) by mouth 2 (two) times a day. 180 tablet 1   metoprolol succinate (TOPROL XL) 25 mg 24 hr tablet Take 12.5 mg by mouth daily.     multivitamin cap Take 1 capsule by mouth Once Daily.     omega-3 acid ethyl esters (LOVAZA ) 1 gram capsule Take 1 capsule by mouth Once Daily.     rosuvastatin  (CRESTOR ) 20 mg tablet Take 1 tablet (20 mg total) by mouth daily. 90 tablet 2   tiZANidine (ZANAFLEX) 2 mg tablet Take 2 mg by mouth every 6 (six) hours as needed.     fluconazole (DIFLUCAN) 150 mg tablet Take 1 tablet (150 mg total) by mouth once for 1 dose. 1 tablet 0   No current facility-administered medications for this visit.  [2] Patient Active Problem List Diagnosis   Dyspnea on exertion   Hypercholesterolemia   Essential hypertension   Acquired hypothyroidism   Type 2 diabetes mellitus with complication, without long-term current use of insulin     (CMD)   Pain in the muscles   Status post coronary artery stent placement   Coronary artery disease involving native coronary artery of native heart without angina pectoris   Atypical chest pain   Sinus bradycardia  [3] History reviewed. No pertinent surgical history. [4] Family History Problem Relation Name Age of Onset   Heart attack Mother     Heart disease Mother     Diabetes Mother     Cancer Neg Hx    "

## 2024-06-20 ENCOUNTER — Emergency Department (HOSPITAL_BASED_OUTPATIENT_CLINIC_OR_DEPARTMENT_OTHER)

## 2024-06-20 ENCOUNTER — Inpatient Hospital Stay (HOSPITAL_BASED_OUTPATIENT_CLINIC_OR_DEPARTMENT_OTHER)
Admission: EM | Admit: 2024-06-20 | Discharge: 2024-06-23 | Disposition: A | Discharge status: Home / Self Care | Attending: Internal Medicine | Admitting: Internal Medicine

## 2024-06-20 ENCOUNTER — Other Ambulatory Visit: Payer: Self-pay

## 2024-06-20 ENCOUNTER — Encounter (HOSPITAL_BASED_OUTPATIENT_CLINIC_OR_DEPARTMENT_OTHER): Payer: Self-pay

## 2024-06-20 DIAGNOSIS — J189 Pneumonia, unspecified organism: Principal | ICD-10-CM | POA: Diagnosis present

## 2024-06-20 DIAGNOSIS — D696 Thrombocytopenia, unspecified: Secondary | ICD-10-CM | POA: Diagnosis not present

## 2024-06-20 DIAGNOSIS — Z880 Allergy status to penicillin: Secondary | ICD-10-CM

## 2024-06-20 DIAGNOSIS — I10 Essential (primary) hypertension: Secondary | ICD-10-CM | POA: Diagnosis not present

## 2024-06-20 DIAGNOSIS — Z85048 Personal history of other malignant neoplasm of rectum, rectosigmoid junction, and anus: Secondary | ICD-10-CM

## 2024-06-20 DIAGNOSIS — E785 Hyperlipidemia, unspecified: Secondary | ICD-10-CM | POA: Diagnosis present

## 2024-06-20 DIAGNOSIS — Z79899 Other long term (current) drug therapy: Secondary | ICD-10-CM

## 2024-06-20 DIAGNOSIS — E119 Type 2 diabetes mellitus without complications: Secondary | ICD-10-CM | POA: Diagnosis present

## 2024-06-20 DIAGNOSIS — Z7984 Long term (current) use of oral hypoglycemic drugs: Secondary | ICD-10-CM

## 2024-06-20 DIAGNOSIS — A419 Sepsis, unspecified organism: Secondary | ICD-10-CM | POA: Diagnosis not present

## 2024-06-20 DIAGNOSIS — K219 Gastro-esophageal reflux disease without esophagitis: Secondary | ICD-10-CM | POA: Diagnosis present

## 2024-06-20 DIAGNOSIS — Y92009 Unspecified place in unspecified non-institutional (private) residence as the place of occurrence of the external cause: Secondary | ICD-10-CM

## 2024-06-20 DIAGNOSIS — G9341 Metabolic encephalopathy: Secondary | ICD-10-CM | POA: Diagnosis present

## 2024-06-20 DIAGNOSIS — E038 Other specified hypothyroidism: Secondary | ICD-10-CM

## 2024-06-20 DIAGNOSIS — E871 Hypo-osmolality and hyponatremia: Secondary | ICD-10-CM | POA: Diagnosis not present

## 2024-06-20 DIAGNOSIS — R0902 Hypoxemia: Secondary | ICD-10-CM | POA: Diagnosis present

## 2024-06-20 DIAGNOSIS — I070 Rheumatic tricuspid stenosis: Secondary | ICD-10-CM | POA: Diagnosis present

## 2024-06-20 DIAGNOSIS — E8809 Other disorders of plasma-protein metabolism, not elsewhere classified: Secondary | ICD-10-CM | POA: Diagnosis present

## 2024-06-20 DIAGNOSIS — W19XXXA Unspecified fall, initial encounter: Secondary | ICD-10-CM | POA: Diagnosis present

## 2024-06-20 DIAGNOSIS — R0989 Other specified symptoms and signs involving the circulatory and respiratory systems: Secondary | ICD-10-CM | POA: Diagnosis present

## 2024-06-20 DIAGNOSIS — R7989 Other specified abnormal findings of blood chemistry: Secondary | ICD-10-CM | POA: Diagnosis not present

## 2024-06-20 DIAGNOSIS — Z1152 Encounter for screening for COVID-19: Secondary | ICD-10-CM

## 2024-06-20 DIAGNOSIS — E039 Hypothyroidism, unspecified: Secondary | ICD-10-CM | POA: Diagnosis present

## 2024-06-20 DIAGNOSIS — Z833 Family history of diabetes mellitus: Secondary | ICD-10-CM

## 2024-06-20 DIAGNOSIS — I110 Hypertensive heart disease with heart failure: Secondary | ICD-10-CM | POA: Diagnosis present

## 2024-06-20 DIAGNOSIS — R0789 Other chest pain: Secondary | ICD-10-CM | POA: Diagnosis present

## 2024-06-20 DIAGNOSIS — I2489 Other forms of acute ischemic heart disease: Secondary | ICD-10-CM | POA: Diagnosis present

## 2024-06-20 DIAGNOSIS — Z8249 Family history of ischemic heart disease and other diseases of the circulatory system: Secondary | ICD-10-CM

## 2024-06-20 DIAGNOSIS — I5031 Acute diastolic (congestive) heart failure: Secondary | ICD-10-CM | POA: Diagnosis not present

## 2024-06-20 DIAGNOSIS — Z803 Family history of malignant neoplasm of breast: Secondary | ICD-10-CM

## 2024-06-20 DIAGNOSIS — Z79890 Hormone replacement therapy (postmenopausal): Secondary | ICD-10-CM

## 2024-06-20 DIAGNOSIS — R6520 Severe sepsis without septic shock: Secondary | ICD-10-CM | POA: Diagnosis present

## 2024-06-20 DIAGNOSIS — Z7982 Long term (current) use of aspirin: Secondary | ICD-10-CM

## 2024-06-20 LAB — COMPREHENSIVE METABOLIC PANEL WITH GFR
ALT: 20 U/L (ref 0–44)
AST: 37 U/L (ref 15–41)
Albumin: 4.5 g/dL (ref 3.5–5.0)
Alkaline Phosphatase: 57 U/L (ref 38–126)
Anion gap: 18 — ABNORMAL HIGH (ref 5–15)
BUN: 18 mg/dL (ref 8–23)
CO2: 25 mmol/L (ref 22–32)
Calcium: 9.2 mg/dL (ref 8.9–10.3)
Chloride: 91 mmol/L — ABNORMAL LOW (ref 98–111)
Creatinine, Ser: 0.97 mg/dL (ref 0.44–1.00)
GFR, Estimated: 59 mL/min — ABNORMAL LOW
Glucose, Bld: 187 mg/dL — ABNORMAL HIGH (ref 70–99)
Potassium: 3.8 mmol/L (ref 3.5–5.1)
Sodium: 133 mmol/L — ABNORMAL LOW (ref 135–145)
Total Bilirubin: 0.6 mg/dL (ref 0.0–1.2)
Total Protein: 7.7 g/dL (ref 6.5–8.1)

## 2024-06-20 LAB — CBC
HCT: 47.2 % — ABNORMAL HIGH (ref 36.0–46.0)
Hemoglobin: 15.8 g/dL — ABNORMAL HIGH (ref 12.0–15.0)
MCH: 28.6 pg (ref 26.0–34.0)
MCHC: 33.5 g/dL (ref 30.0–36.0)
MCV: 85.4 fL (ref 80.0–100.0)
Platelets: 191 K/uL (ref 150–400)
RBC: 5.53 MIL/uL — ABNORMAL HIGH (ref 3.87–5.11)
RDW: 13.8 % (ref 11.5–15.5)
WBC: 13.3 K/uL — ABNORMAL HIGH (ref 4.0–10.5)
nRBC: 0 % (ref 0.0–0.2)

## 2024-06-20 LAB — GLUCOSE, CAPILLARY: Glucose-Capillary: 188 mg/dL — ABNORMAL HIGH (ref 70–99)

## 2024-06-20 LAB — RESP PANEL BY RT-PCR (RSV, FLU A&B, COVID)  RVPGX2
Influenza A by PCR: NEGATIVE
Influenza B by PCR: NEGATIVE
Resp Syncytial Virus by PCR: NEGATIVE
SARS Coronavirus 2 by RT PCR: NEGATIVE

## 2024-06-20 LAB — PRO BRAIN NATRIURETIC PEPTIDE: Pro Brain Natriuretic Peptide: 1893 pg/mL — ABNORMAL HIGH

## 2024-06-20 LAB — TROPONIN T, HIGH SENSITIVITY: Troponin T High Sensitivity: 46 ng/L — ABNORMAL HIGH (ref 0–19)

## 2024-06-20 LAB — LACTIC ACID, PLASMA: Lactic Acid, Venous: 2.3 mmol/L (ref 0.5–1.9)

## 2024-06-20 MED ORDER — ENOXAPARIN SODIUM 40 MG/0.4ML IJ SOSY
40.0000 mg | PREFILLED_SYRINGE | INTRAMUSCULAR | Status: DC
  Administered 2024-06-21 – 2024-06-23 (×3): 40 mg via SUBCUTANEOUS
  Filled 2024-06-20 (×3): qty 0.4

## 2024-06-20 MED ORDER — INSULIN ASPART 100 UNIT/ML IJ SOLN
0.0000 [IU] | Freq: Three times a day (TID) | INTRAMUSCULAR | Status: DC
  Administered 2024-06-21 – 2024-06-22 (×2): 1 [IU] via SUBCUTANEOUS
  Administered 2024-06-23: 3 [IU] via SUBCUTANEOUS
  Filled 2024-06-20: qty 1
  Filled 2024-06-20: qty 3
  Filled 2024-06-20 (×2): qty 1

## 2024-06-20 MED ORDER — SODIUM CHLORIDE 0.9 % IV SOLN
100.0000 mg | Freq: Two times a day (BID) | INTRAVENOUS | Status: DC
  Administered 2024-06-20 – 2024-06-23 (×6): 100 mg via INTRAVENOUS
  Filled 2024-06-20 (×6): qty 100

## 2024-06-20 MED ORDER — SODIUM CHLORIDE 0.9 % IV SOLN
500.0000 mg | Freq: Once | INTRAVENOUS | Status: AC
  Administered 2024-06-20: 500 mg via INTRAVENOUS
  Filled 2024-06-20: qty 5

## 2024-06-20 MED ORDER — LACTATED RINGERS IV SOLN: INTRAVENOUS | Status: AC

## 2024-06-20 MED ORDER — LACTATED RINGERS IV BOLUS (SEPSIS)
1000.0000 mL | Freq: Once | INTRAVENOUS | Status: AC
  Administered 2024-06-20: 1000 mL via INTRAVENOUS

## 2024-06-20 MED ORDER — LACTATED RINGERS IV BOLUS (SEPSIS)
250.0000 mL | Freq: Once | INTRAVENOUS | Status: AC
  Administered 2024-06-20: 250 mL via INTRAVENOUS

## 2024-06-20 MED ORDER — INSULIN ASPART 100 UNIT/ML IJ SOLN: 0.0000 [IU] | Freq: Every day | INTRAMUSCULAR | Status: DC

## 2024-06-20 MED ORDER — ALBUTEROL SULFATE (2.5 MG/3ML) 0.083% IN NEBU: 2.5000 mg | INHALATION_SOLUTION | Freq: Four times a day (QID) | RESPIRATORY_TRACT | Status: DC | PRN

## 2024-06-20 MED ORDER — SODIUM CHLORIDE 0.9 % IV SOLN
2.0000 g | INTRAVENOUS | Status: DC
  Administered 2024-06-21 – 2024-06-23 (×3): 2 g via INTRAVENOUS
  Filled 2024-06-20 (×3): qty 20

## 2024-06-20 MED ORDER — ONDANSETRON HCL 4 MG PO TABS: 4.0000 mg | ORAL_TABLET | Freq: Four times a day (QID) | ORAL | Status: DC | PRN

## 2024-06-20 MED ORDER — VITAMIN D 25 MCG (1000 UNIT) PO TABS
1000.0000 [IU] | ORAL_TABLET | Freq: Every morning | ORAL | Status: DC
  Administered 2024-06-21 – 2024-06-23 (×3): 1000 [IU] via ORAL
  Filled 2024-06-20 (×3): qty 1

## 2024-06-20 MED ORDER — LIDOCAINE 5 % EX PTCH
1.0000 | MEDICATED_PATCH | CUTANEOUS | Status: DC
  Administered 2024-06-20 – 2024-06-22 (×3): 1 via TRANSDERMAL
  Filled 2024-06-20 (×3): qty 1

## 2024-06-20 MED ORDER — ACETAMINOPHEN 650 MG RE SUPP: 650.0000 mg | Freq: Four times a day (QID) | RECTAL | Status: DC | PRN

## 2024-06-20 MED ORDER — SODIUM CHLORIDE 0.9% FLUSH
3.0000 mL | INTRAVENOUS | Status: DC | PRN
  Administered 2024-06-22: 3 mL via INTRAVENOUS

## 2024-06-20 MED ORDER — SODIUM CHLORIDE 0.9% FLUSH
3.0000 mL | Freq: Two times a day (BID) | INTRAVENOUS | Status: DC
  Administered 2024-06-20 – 2024-06-23 (×6): 3 mL via INTRAVENOUS

## 2024-06-20 MED ORDER — SODIUM CHLORIDE 0.9 % IV SOLN: 250.0000 mL | INTRAVENOUS | Status: AC | PRN

## 2024-06-20 MED ORDER — GUAIFENESIN ER 600 MG PO TB12
600.0000 mg | ORAL_TABLET | Freq: Two times a day (BID) | ORAL | Status: DC
  Administered 2024-06-20 – 2024-06-21 (×2): 600 mg via ORAL
  Filled 2024-06-20 (×2): qty 1

## 2024-06-20 MED ORDER — ONDANSETRON HCL 4 MG/2ML IJ SOLN: 4.0000 mg | Freq: Four times a day (QID) | INTRAMUSCULAR | Status: DC | PRN

## 2024-06-20 MED ORDER — LACTATED RINGERS IV SOLN: INTRAVENOUS | Status: DC

## 2024-06-20 MED ORDER — ASPIRIN 81 MG PO CHEW
81.0000 mg | CHEWABLE_TABLET | Freq: Every day | ORAL | Status: DC
  Administered 2024-06-21 – 2024-06-23 (×3): 81 mg via ORAL
  Filled 2024-06-20 (×3): qty 1

## 2024-06-20 MED ORDER — DOCUSATE SODIUM 100 MG PO CAPS
100.0000 mg | ORAL_CAPSULE | Freq: Two times a day (BID) | ORAL | Status: DC
  Administered 2024-06-20 – 2024-06-23 (×6): 100 mg via ORAL
  Filled 2024-06-20 (×6): qty 1

## 2024-06-20 MED ORDER — OMEGA-3-ACID ETHYL ESTERS 1 G PO CAPS
1.0000 g | ORAL_CAPSULE | Freq: Two times a day (BID) | ORAL | Status: DC
  Administered 2024-06-20 – 2024-06-23 (×6): 1 g via ORAL
  Filled 2024-06-20 (×6): qty 1

## 2024-06-20 MED ORDER — LEVOTHYROXINE SODIUM 75 MCG PO TABS
75.0000 ug | ORAL_TABLET | Freq: Every day | ORAL | Status: DC
  Administered 2024-06-21 – 2024-06-23 (×3): 75 ug via ORAL
  Filled 2024-06-20 (×3): qty 1

## 2024-06-20 MED ORDER — SODIUM CHLORIDE 0.9 % IV SOLN
1.0000 g | Freq: Once | INTRAVENOUS | Status: AC
  Administered 2024-06-20: 1 g via INTRAVENOUS
  Filled 2024-06-20: qty 10

## 2024-06-20 MED ORDER — ACETAMINOPHEN 325 MG PO TABS
650.0000 mg | ORAL_TABLET | Freq: Four times a day (QID) | ORAL | Status: DC | PRN
  Administered 2024-06-21 – 2024-06-23 (×3): 650 mg via ORAL
  Filled 2024-06-20 (×3): qty 2

## 2024-06-20 MED ORDER — IPRATROPIUM-ALBUTEROL 0.5-2.5 (3) MG/3ML IN SOLN
3.0000 mL | Freq: Once | RESPIRATORY_TRACT | Status: AC
  Administered 2024-06-20: 3 mL via RESPIRATORY_TRACT
  Filled 2024-06-20: qty 3

## 2024-06-20 MED ORDER — KETOROLAC TROMETHAMINE 15 MG/ML IJ SOLN
7.5000 mg | Freq: Four times a day (QID) | INTRAMUSCULAR | Status: DC | PRN
  Administered 2024-06-21 – 2024-06-22 (×4): 15 mg via INTRAVENOUS
  Filled 2024-06-20 (×4): qty 1

## 2024-06-20 NOTE — Sepsis Progress Note (Signed)
 Following for sepsis monitoring ?

## 2024-06-20 NOTE — ED Triage Notes (Addendum)
 Pt is here for a productive cough x 2 days, a little bit of white phlegm, weakness and her husband said she has been confused this afternoon, which is abnormal for her. She is confused with the year. Husband also reports she had a fall today and now reports lower back pain. Denies head injury, no blood thinners. Unsure if the confusion started before or after the fall. Husband also reports a strong smell from her urine.

## 2024-06-20 NOTE — H&P (Signed)
 " History and Physical    Destiny Decker FMW:[id number] DOB: 1945-10-17 DOA: 06/20/2024  PCP: Merilee, L.Addie, MD (Inactive)   Patient coming from: Home   Chief Complaint:  Chief Complaint  Patient presents with   Cough   ED TRIAGE note:Pt is here for a productive cough x 2 days, a little bit of white phlegm, weakness and her husband said she has been confused this afternoon, which is abnormal for her. She is confused with the year. Husband also reports she had a fall today and now reports lower back pain. Denies head injury, no blood thinners. Unsure if the confusion started before or after the fall. Husband also reports a strong smell from her urine.   HPI:  Destiny Decker is a 78 y.o. female with medical history significant of essential hypertension, non-insulin -dependent DM type II, hyperlipidemia and hypothyroidism present emergency department complaining of productive cough for several days with associated generalized weakness.  At baseline  patient lives independently and has normal mental status. This morning she was extremely fatigued and weak. She did have a fall and hit her left posterior ribs on a door. She reports she has a little bit of pain in that area but not severe. She denies any pain with deep inspiration breathing or coughing. There has been no vomiting or diarrhea. Denies any fever and chills.  Other workup in the ED showed complete echo pneumonia patient been transferred to [facility] for further management.   ED Course:  At presentation to ED patient found febrile, borderline hypertensive, tachypneic and a requirement of oxygen 90 to 96% on 2 L.  Lab work, elevated lactic acid 2.3 Elevated troponin 46. Elevated proBNP 1893. Blood cultures are in process. CBC showed leukocytosis 13.3 elevated hemoglobin 15.8 otherwise unremarkable. CMP showing low sodium 133 and elevated anion gap 18. Respiratory panel negative for COVID RSV flu.  Chest x-ray showing patchy  left lower lobe infiltrate suspicion for pneumonia. In the ED patient received 2.5 L of LR bolus currently on maintenance fluid LR, azithromycin  and ceftriaxone .  Patient has been admitted for sepsis in the setting of complete echo pneumonia elevated troponin secondary to pulm demand ischemia, elevated proBNP.  Significant labs in the ED: Lab Orders         Resp panel by RT-PCR (RSV, Flu A&B, Covid) Anterior Nasal Swab         Culture, blood (routine x 2)         Expectorated Sputum Assessment w Gram Stain, Rflx to Resp Cult         Comprehensive metabolic panel         CBC         Pro Brain natriuretic peptide         Lactic acid, plasma         Glucose, capillary         Lactic acid, plasma         Legionella Pneumophila Serogp 1 Ur Ag         Strep pneumoniae urinary antigen         Procalcitonin         CBG monitoring, ED       Review of Systems:  Review of Systems  Constitutional:  Positive for fever and malaise/fatigue. Negative for chills and weight loss.  Respiratory:  Positive for cough, sputum production and shortness of breath. Negative for wheezing.   Cardiovascular:  Negative for chest pain and palpitations.  Gastrointestinal:  Negative for  abdominal pain, diarrhea, heartburn, nausea and vomiting.  Musculoskeletal:  Negative for myalgias.  Neurological:  Negative for dizziness and headaches.  Psychiatric/Behavioral:  Negative for hallucinations. The patient is not nervous/anxious.        Confusion    Past Medical History:  Diagnosis Date   Anal squamous cell carcinoma (HCC)    Complication of anesthesia    HARD TO WAKE   Diabetes mellitus, type 2 (HCC)    Hemorrhoids    Hypertension    Hypothyroidism    Mild acid reflux    Nocturia     Past Surgical History:  Procedure Laterality Date   APPENDECTOMY  AGE 67   EXAMINATION UNDER ANESTHESIA N/A 01/04/2013   Procedure: EXAM UNDER ANESTHESIA;  Surgeon: Bernarda Ned, MD;  Location: [facility] [place];   Service: General;  Laterality: N/A;   LEFT KNEE ARTHROSCOPY W/ DEBRIDEMENT AND REMOVAL GANGLION CYST  05-17-2008   TRANSANAL EXCISION OF RECTAL MASS N/A 09/12/2012   Procedure: TRANSANAL EXCISION OF RECTAL MASS;  Surgeon: Bernarda Ned, MD;  Location: [facility] [place];  Service: General;  Laterality: N/A;   WISDOM TOOTH EXTRACTION       reports that she has never smoked. She has never used smokeless tobacco. She reports that she does not drink alcohol and does not use drugs.  Allergies[1]  Family History  Problem Relation Age of Onset   Diabetes Mother    Breast cancer Sister    Cancer Sister        breast   Hypertension Sister     Prior to Admission medications  Medication Sig Start Date End Date Taking? Authorizing Provider  amLODipine  (NORVASC ) 5 MG tablet Take 5 mg by mouth every morning.     [provider]  Aspirin  81 MG EC tablet Take 81 mg by mouth daily.    [provider]  calcium  carbonate (TUMS - DOSED IN MG ELEMENTAL CALCIUM ) 500 MG chewable tablet Chew 1 tablet by mouth as needed.     [provider]  Cholecalciferol  (VITAMIN D3) 1000 UNITS CAPS Take 1 capsule by mouth every morning.    [provider]  clindamycin  (CLEOCIN ) 150 MG capsule Take 1 capsule (150 mg total) by mouth 4 (four) times daily. 01/30/15   Tuchman, Charlie BROCKS, DPM  diazepam  (VALIUM ) 5 MG tablet Take 1 tablet (5 mg total) by mouth every 6 (six) hours as needed (rectal spasms, inability to urinate). 09/12/12   Ned Bernarda, MD  docusate sodium  (COLACE) 100 MG capsule Take 1 capsule (100 mg total) by mouth 2 (two) times daily. 01/04/13   Thomas, Alicia, MD  erythromycin ophthalmic ointment  01/28/15   [provider]  fish oil-omega-3 fatty acids 1000 MG capsule Take 1 g by mouth 2 (two) times daily.    [provider]  ibuprofen (ADVIL,MOTRIN) 200 MG tablet Take 600 mg by mouth as needed for pain.    [provider]  levothyroxine   (SYNTHROID , LEVOTHROID) 75 MCG tablet Take 75 mcg by mouth daily before breakfast.    [provider]  lisinopril (PRINIVIL,ZESTRIL) 10 MG tablet  12/29/14   [provider]  meloxicam (MOBIC) 15 MG tablet  01/22/15   [provider]  metFORMIN (GLUCOPHAGE) 500 MG tablet Take 500 mg by mouth 2 (two) times daily with a meal.    [provider]  AISHA PASTOR LANCETS 33G MISC  01/15/15   [provider]  ONETOUCH VERIO test strip  01/15/15  [provider]  oxyCODONE  (OXY IR/ROXICODONE ) 5 MG immediate release tablet Take 1-2 tablets (5-10 mg total) by mouth every 4 (four) hours as needed for pain. 01/04/13   Thomas, Alicia, MD  pravastatin (PRAVACHOL) 40 MG tablet  01/21/15   [provider]  promethazine -codeine (PHENERGAN  WITH CODEINE) 6.25-10 MG/5ML syrup  01/24/15   [provider]  Psyllium (DIETARY FIBER LAXATIVE) 28.3 % POWD Take 1 application by mouth 2 (two) times daily. 09/12/12   Thomas, Alicia, MD  tiZANidine (ZANAFLEX) 2 MG tablet  11/30/14   [provider]     Physical Exam: Vitals:   06/20/24 1900 06/20/24 1918 06/20/24 2032 06/20/24 2154  BP: 121/68  129/64 (!) 134/56  Pulse: 80  73 73  Resp: (!) 25  16 20   Temp: (!) 101.3 F (38.5 C)  98.6 F (37 C) 99.1 F (37.3 C)  TempSrc: Oral  Oral Oral  SpO2: 90% 96% 94% 95%  Weight:      Height:        Physical Exam Vitals and nursing note reviewed.  Constitutional:      General: She is not in acute distress.    Appearance: She is not ill-appearing.  HENT:     Mouth/Throat:     Mouth: Mucous membranes are moist.  Eyes:     Pupils: Pupils are equal, round, and reactive to light.  Cardiovascular:     Rate and Rhythm: Normal rate and regular rhythm.     Pulses: Normal pulses.     Heart sounds: Normal heart sounds.  Pulmonary:     Effort: Pulmonary effort is normal.     Breath sounds: Normal breath sounds.  Abdominal:     Palpations: Abdomen is  soft.  Musculoskeletal:     Cervical back: Neck supple.  Skin:    Capillary Refill: Capillary refill takes less than 2 seconds.  Neurological:     Mental Status: She is alert and oriented to person, place, and time.  Psychiatric:        Mood and Affect: Mood normal.      Labs on Admission: I have personally reviewed following labs and imaging studies  CBC: Recent Labs  Lab 06/20/24 1703  WBC 13.3*  HGB 15.8*  HCT 47.2*  MCV 85.4  PLT 191   Basic Metabolic Panel: Recent Labs  Lab 06/20/24 1703  NA 133*  K 3.8  CL 91*  CO2 25  GLUCOSE 187*  BUN 18  CREATININE 0.97  CALCIUM  9.2   GFR: Estimated Creatinine Clearance: 44 mL/min (by C-G formula based on SCr of 0.97 mg/dL). Liver Function Tests: Recent Labs  Lab 06/20/24 1703  AST 37  ALT 20  ALKPHOS 57  BILITOT 0.6  PROT 7.7  ALBUMIN 4.5   No results for input(s): LIPASE, AMYLASE in the last 168 hours. No results for input(s): AMMONIA in the last 168 hours. Coagulation Profile: No results for input(s): INR, PROTIME in the last 168 hours. Cardiac Enzymes: No results for input(s): CKTOTAL, CKMB, CKMBINDEX, TROPONINI, TROPONINIHS in the last 168 hours. BNP (last 3 results) No results for input(s): BNP in the last 8760 hours. HbA1C: No results for input(s): HGBA1C in the last 72 hours. CBG: Recent Labs  Lab 06/20/24 2138  GLUCAP 188*   Lipid Profile: No results for input(s): CHOL, HDL, LDLCALC, TRIG, CHOLHDL, LDLDIRECT in the last 72 hours. Thyroid  Function Tests: No results for input(s): TSH, T4TOTAL, FREET4, T3FREE, THYROIDAB in the last 72 hours. Anemia Panel: No  results for input(s): VITAMINB12, FOLATE, FERRITIN, TIBC, IRON, RETICCTPCT in the last 72 hours. Urine analysis: No results found for: COLORURINE, APPEARANCEUR, LABSPEC, PHURINE, GLUCOSEU, HGBUR, BILIRUBINUR, KETONESUR, PROTEINUR, UROBILINOGEN, NITRITE,  LEUKOCYTESUR  Radiological Exams on Admission: I have personally reviewed images DG Chest 2 View Result Date: 06/20/2024 CLINICAL DATA:  Productive cough. EXAM: CHEST - 2 VIEW COMPARISON:  04/26/2006 FINDINGS: Patchy left lower lobe opacity suspicious for pneumonia. The heart is normal in size. Mediastinal contours are normal. No pulmonary edema, pleural effusion, or pneumothorax. No acute osseous findings. IMPRESSION: Patchy left lower lobe opacity suspicious for pneumonia. Electronically Signed   By: Andrea Gasman M.D.   On: 06/20/2024 18:56   EKG showing to be atrial rhythm heart rate 91.  There is no history of abnormality.   Assessment/Plan: Principal Problem:   Sepsis due to pneumonia [facility]) Active Problems:   Acute metabolic encephalopathy   Non-insulin  dependent type 2 diabetes mellitus (HCC)   Essential hypertension   Elevated troponin   Elevated brain natriuretic peptide (BNP) level   Hypothyroidism    Assessment and Plan: Sepsis secondary to pneumonia Metabolic encephalopathy/confusion in setting of pneumonia -Presented to emergency department complaining of fever, chill, productive cough and confusion per patient husband for last few days.  At presentation to ED patient found febrile, borderline hypotensive and tachypneic.  New recomment of oxygen 2 L O2 sat 90 to 96%.  However oxygen saturation never dropped below 88%. - In the setting of leukocytosis, elevated lactic acid, febrile and chest x-ray showed evidence for pneumonia patient meets sepsis criteria. - In the ED patient received ceftriaxone  azithromycin  and 2.5 L of LR bolus and code sepsis has been activated - Plan to continue maintenance fluid LR 150 cc/h, IV ceftriaxone  and IV doxycycline . - Respiratory negative for COVID RSV flu.  Blood cultures are in process.  Checking sputum culture, urine Legionella, urine strep antigen and procalcitonin level. - Need to follow-up with culture result for antibiotic  guidance. - Continue supportive care and wean down oxygen to room air as patient tolerates.  Elevated troponin secondary to demand ischemia in the context of pneumonia Elevated troponin 46.  Checking second troponin level.  Concern for elevated troponin in the setting of demand ischemia in the context of sepsis and pneumonia.  EKG showing ectopic atrial rhythm heart rate 96.  There is no history of abnormality.  At this time there is no concern for ACS.  Obtaining echocardiogram to assess for any wall motion abnormality.  Continue cardiac monitoring.  Elevated BNP Elevated BNP around 1900.  No previous echocardiogram on the chart to assess baseline heart function.  Concern for elevated BNP in the setting of acute reactive process.  Obtaining echo.  Left-sided lower chest wall pain secondary to fall - Patient reported left-sided lower chest wall pain with coughing since the fall.  Chest x-ray no evidence of fracture or dislocation.  Continue conservative management with lidocaine  patch and Tylenol  as needed.  Continue Toradol  as needed for severe pain control.  Essential hypertension - In the setting of sepsis holding amlodipine .  Continue to monitor blood pressure  Non-insulin  insulin -dependent DM type II -At home patient is on Farxiga.  Continue sliding scale insulin  with mealtime coverage.  Hypothyroidism -Continue levothyroxine .   DVT prophylaxis:  Lovenox  Code Status:  Full Code Diet: Heart healthy carb modified diet Family Communication:   Family was present at bedside, at the time of interview. Opportunity was given to ask question and all questions were answered satisfactorily.  Disposition Plan: Need to follow-up with blood culture sputum cultures result. Consults: None indicated at this time Admission status:   Observation, Telemetry bed  Severity of Illness: The appropriate patient status for this patient is OBSERVATION. Observation status is judged to be reasonable and  necessary in order to provide the required intensity of service to ensure the patient's safety. The patient's presenting symptoms, physical exam findings, and initial radiographic and laboratory data in the context of their medical condition is felt to place them at decreased risk for further clinical deterioration. Furthermore, it is anticipated that the patient will be medically stable for discharge from the hospital within 2 midnights of admission.     Jemar Paulsen, MD [facility]  How to contact the [facility] Attending or Consulting provider 7A - 7P or covering provider during after hours 7P -7A, for this patient.  Check the care team in [facility] and look for a) attending/consulting [facility] provider listed and b) the [facility] team listed Log into [email or web address] and use [place]'s universal password to access. If you do not have the password, please contact the hospital operator. Locate the [facility] provider you are looking for under [facility] and page to a number that you can be directly reached. If you still have difficulty reaching the provider, please page the [facility] (Director on Call) for the Hospitalists listed on amion for assistance.  06/20/2024, 10:04 PM           [1]  Allergies Allergen Reactions   Penicillins Itching    In groin area only. In groin area only. In groin area only. In groin area only.    "

## 2024-06-20 NOTE — ED Notes (Signed)
 Asked EDP about in and out cath she said Pt. Could try to urinate on her own

## 2024-06-20 NOTE — ED Notes (Signed)
 Called in patient floor to inform IP RN to read chart and sign off handoff. Call or chat Hunter Moats RN [phone] .[phone] for any Questions. [facility] at bedside.

## 2024-06-20 NOTE — ED Provider Notes (Signed)
 " [place] [facility] AT [facility] Provider Note   CSN: [id number] Arrival date & time: 06/20/24  1647     Patient presents with: Cough   Destiny Decker is a 78 y.o. female.   HPI Patient where she is at a cough for several days.  She was has been productive of a little bit of phlegm.  Patient has also been more weak recently.  At baseline, patient lives independently and has normal mental status.  This morning she was extremely fatigued and weak.  She did have a fall and hit her left posterior ribs on a door.  She reports she has a little bit of pain in that area but not severe.  She denies any pain with deep inspiration breathing or coughing.  There has been no vomiting or diarrhea.  No fever measured.    Prior to Admission medications  Medication Sig Start Date End Date Taking? Authorizing Provider  amLODipine  (NORVASC ) 5 MG tablet Take 5 mg by mouth every morning.     [provider]  Aspirin  81 MG EC tablet Take 81 mg by mouth daily.    [provider]  calcium  carbonate (TUMS - DOSED IN MG ELEMENTAL CALCIUM ) 500 MG chewable tablet Chew 1 tablet by mouth as needed.     [provider]  Cholecalciferol  (VITAMIN D3) 1000 UNITS CAPS Take 1 capsule by mouth every morning.    [provider]  clindamycin  (CLEOCIN ) 150 MG capsule Take 1 capsule (150 mg total) by mouth 4 (four) times daily. 01/30/15   Tuchman, Charlie BROCKS, DPM  diazepam  (VALIUM ) 5 MG tablet Take 1 tablet (5 mg total) by mouth every 6 (six) hours as needed (rectal spasms, inability to urinate). 09/12/12   Debby Hila, MD  docusate sodium  (COLACE) 100 MG capsule Take 1 capsule (100 mg total) by mouth 2 (two) times daily. 01/04/13   Thomas, Alicia, MD  erythromycin ophthalmic ointment  01/28/15   [provider]  fish oil-omega-3 fatty acids 1000 MG capsule Take 1 g by mouth 2 (two) times daily.    [provider]  ibuprofen (ADVIL,MOTRIN) 200 MG tablet Take  600 mg by mouth as needed for pain.    [provider]  levothyroxine  (SYNTHROID , LEVOTHROID) 75 MCG tablet Take 75 mcg by mouth daily before breakfast.    [provider]  lisinopril (PRINIVIL,ZESTRIL) 10 MG tablet  12/29/14   [provider]  meloxicam (MOBIC) 15 MG tablet  01/22/15   [provider]  metFORMIN (GLUCOPHAGE) 500 MG tablet Take 500 mg by mouth 2 (two) times daily with a meal.    [provider]  AISHA PASTOR LANCETS 33G MISC  01/15/15   [provider]  [facility] VERIO test strip  01/15/15   [provider]  oxyCODONE  (OXY IR/ROXICODONE ) 5 MG immediate release tablet Take 1-2 tablets (5-10 mg total) by mouth every 4 (four) hours as needed for pain. 01/04/13   Thomas, Alicia, MD  pravastatin (PRAVACHOL) 40 MG tablet  01/21/15   [provider]  promethazine -codeine (PHENERGAN  WITH CODEINE) 6.25-10 MG/5ML syrup  01/24/15   [provider]  Psyllium (DIETARY FIBER LAXATIVE) 28.3 % POWD Take 1 application by mouth 2 (two) times daily. 09/12/12   Thomas, Alicia, MD  tiZANidine (ZANAFLEX) 2 MG tablet  11/30/14   [provider]    Allergies: Penicillins    Review of Systems  Updated Vital Signs BP 121/68 (BP Location: Left Arm)  Pulse 80   Temp (!) 101.3 F (38.5 C) (Oral)   Resp (!) 25   Ht 5' 6 (1.676 m)   Wt 68 kg   SpO2 90%   BMI 24.21 kg/m   Physical Exam Constitutional:      Comments: Patient is alert.  Answering questions appropriately.  No respiratory distress at rest.  HENT:     Head: Normocephalic and atraumatic.     Mouth/Throat:     Pharynx: Oropharynx is clear.  Cardiovascular:     Rate and Rhythm: Normal rate and regular rhythm.  Pulmonary:     Comments: Patient has coarse rectal diffusely in the lung fields.  No respiratory distress.  However she does have hypoxia consistent at 88 to 89% on room air. Abdominal:     General: There is no distension.     Palpations:  Abdomen is soft.     Tenderness: There is no abdominal tenderness. There is no guarding.  Musculoskeletal:        General: No swelling or tenderness. Normal range of motion.     Cervical back: Neck supple.     Right lower leg: No edema.     Left lower leg: No edema.  Skin:    General: Skin is warm and dry.  Neurological:     General: No focal deficit present.     Mental Status: She is oriented to person, place, and time.     Coordination: Coordination normal.  Psychiatric:        Mood and Affect: Mood normal.     (all labs ordered are listed, but only abnormal results are displayed) Labs Reviewed  COMPREHENSIVE METABOLIC PANEL WITH GFR - Abnormal; Notable for the following components:      Result Value   Sodium 133 (*)    Chloride 91 (*)    Glucose, Bld 187 (*)    GFR, Estimated 59 (*)    Anion gap 18 (*)    All other components within normal limits  CBC - Abnormal; Notable for the following components:   WBC 13.3 (*)    RBC 5.53 (*)    Hemoglobin 15.8 (*)    HCT 47.2 (*)    All other components within normal limits  RESP PANEL BY RT-PCR (RSV, FLU A&B, COVID)  RVPGX2  CULTURE, BLOOD (ROUTINE X 2)  CULTURE, BLOOD (ROUTINE X 2)  URINALYSIS, ROUTINE W REFLEX MICROSCOPIC  PRO BRAIN NATRIURETIC PEPTIDE  LACTIC ACID, PLASMA  LACTIC ACID, PLASMA  CBG MONITORING, ED  TROPONIN T, HIGH SENSITIVITY    EKG: None EKG read not importing from MUSE.  Regular narrow complex rhythm.  No acute ischemic appearance. Radiology: DG Chest 2 View Result Date: 06/20/2024 CLINICAL DATA:  Productive cough. EXAM: CHEST - 2 VIEW COMPARISON:  04/26/2006 FINDINGS: Patchy left lower lobe opacity suspicious for pneumonia. The heart is normal in size. Mediastinal contours are normal. No pulmonary edema, pleural effusion, or pneumothorax. No acute osseous findings. IMPRESSION: Patchy left lower lobe opacity suspicious for pneumonia. Electronically Signed   By: Andrea Gasman M.D.   On: 06/20/2024  18:56     Procedures   Medications Ordered in the ED  ipratropium-albuterol  (DUONEB) 0.5-2.5 (3) MG/3ML nebulizer solution 3 mL (has no administration in time range)  cefTRIAXone  (ROCEPHIN ) 1 g in sodium chloride  0.9 % 100 mL IVPB (has no administration in time range)  azithromycin  (ZITHROMAX ) 500 mg in sodium chloride  0.9 % 250 mL IVPB (has no administration in time range)  lactated  ringers  infusion (has no administration in time range)                                    Medical Decision Making Amount and/or Complexity of Data Reviewed Labs: ordered. Radiology: ordered.  Risk Prescription drug management. Decision regarding hospitalization.   Patient presents as outlined.  She has productive cough.  She now has documented fever.  She patient is experience increasing weakness.  On arrival oxygen saturation is in the high 80s.  At baseline patient does not have known pulmonary disease or oxygen requirement.  Chest x-ray personally reviewed by myself and interpreted radiology positive for left lower lobe area of infiltrate.  White count 13.3.  Influenza panel negative.  Findings consistent with community-acquired pneumonia.  With fever, hypoxia and some general weakness we will plan for admission.  Will start fluid resuscitation, antibiotics with Rocephin  and Zithromax  and a DuoNeb therapy.  Consult: [facility] hospitalist Dr. Franky for admission.     Final diagnoses:  Community acquired pneumonia of left lower lobe of lung  Hypoxia    ED Discharge Orders     None          Armenta Canning, MD 06/20/24 1951  "

## 2024-06-21 ENCOUNTER — Observation Stay (HOSPITAL_COMMUNITY)

## 2024-06-21 DIAGNOSIS — R0989 Other specified symptoms and signs involving the circulatory and respiratory systems: Secondary | ICD-10-CM | POA: Diagnosis present

## 2024-06-21 DIAGNOSIS — R0602 Shortness of breath: Secondary | ICD-10-CM | POA: Diagnosis not present

## 2024-06-21 DIAGNOSIS — I2602 Saddle embolus of pulmonary artery with acute cor pulmonale: Secondary | ICD-10-CM

## 2024-06-21 DIAGNOSIS — Z79890 Hormone replacement therapy (postmenopausal): Secondary | ICD-10-CM | POA: Diagnosis not present

## 2024-06-21 DIAGNOSIS — R6520 Severe sepsis without septic shock: Secondary | ICD-10-CM | POA: Diagnosis present

## 2024-06-21 DIAGNOSIS — I10 Essential (primary) hypertension: Secondary | ICD-10-CM | POA: Diagnosis not present

## 2024-06-21 DIAGNOSIS — I070 Rheumatic tricuspid stenosis: Secondary | ICD-10-CM | POA: Diagnosis present

## 2024-06-21 DIAGNOSIS — J189 Pneumonia, unspecified organism: Secondary | ICD-10-CM | POA: Diagnosis present

## 2024-06-21 DIAGNOSIS — Z7982 Long term (current) use of aspirin: Secondary | ICD-10-CM | POA: Diagnosis not present

## 2024-06-21 DIAGNOSIS — D696 Thrombocytopenia, unspecified: Secondary | ICD-10-CM | POA: Diagnosis not present

## 2024-06-21 DIAGNOSIS — A419 Sepsis, unspecified organism: Secondary | ICD-10-CM | POA: Diagnosis present

## 2024-06-21 DIAGNOSIS — I5033 Acute on chronic diastolic (congestive) heart failure: Secondary | ICD-10-CM | POA: Diagnosis not present

## 2024-06-21 DIAGNOSIS — E785 Hyperlipidemia, unspecified: Secondary | ICD-10-CM | POA: Diagnosis present

## 2024-06-21 DIAGNOSIS — I5031 Acute diastolic (congestive) heart failure: Secondary | ICD-10-CM | POA: Diagnosis not present

## 2024-06-21 DIAGNOSIS — Z833 Family history of diabetes mellitus: Secondary | ICD-10-CM | POA: Diagnosis not present

## 2024-06-21 DIAGNOSIS — I2489 Other forms of acute ischemic heart disease: Secondary | ICD-10-CM | POA: Diagnosis present

## 2024-06-21 DIAGNOSIS — E039 Hypothyroidism, unspecified: Secondary | ICD-10-CM

## 2024-06-21 DIAGNOSIS — Z79899 Other long term (current) drug therapy: Secondary | ICD-10-CM | POA: Diagnosis not present

## 2024-06-21 DIAGNOSIS — E8809 Other disorders of plasma-protein metabolism, not elsewhere classified: Secondary | ICD-10-CM | POA: Diagnosis present

## 2024-06-21 DIAGNOSIS — R0902 Hypoxemia: Secondary | ICD-10-CM | POA: Diagnosis present

## 2024-06-21 DIAGNOSIS — I110 Hypertensive heart disease with heart failure: Secondary | ICD-10-CM | POA: Diagnosis present

## 2024-06-21 DIAGNOSIS — R7989 Other specified abnormal findings of blood chemistry: Secondary | ICD-10-CM | POA: Diagnosis not present

## 2024-06-21 DIAGNOSIS — E871 Hypo-osmolality and hyponatremia: Secondary | ICD-10-CM | POA: Diagnosis not present

## 2024-06-21 DIAGNOSIS — R0789 Other chest pain: Secondary | ICD-10-CM | POA: Diagnosis present

## 2024-06-21 DIAGNOSIS — Y92009 Unspecified place in unspecified non-institutional (private) residence as the place of occurrence of the external cause: Secondary | ICD-10-CM | POA: Diagnosis not present

## 2024-06-21 DIAGNOSIS — E119 Type 2 diabetes mellitus without complications: Secondary | ICD-10-CM | POA: Diagnosis present

## 2024-06-21 DIAGNOSIS — Z7984 Long term (current) use of oral hypoglycemic drugs: Secondary | ICD-10-CM | POA: Diagnosis not present

## 2024-06-21 DIAGNOSIS — W19XXXA Unspecified fall, initial encounter: Secondary | ICD-10-CM | POA: Diagnosis present

## 2024-06-21 DIAGNOSIS — Z1152 Encounter for screening for COVID-19: Secondary | ICD-10-CM | POA: Diagnosis not present

## 2024-06-21 DIAGNOSIS — Z8249 Family history of ischemic heart disease and other diseases of the circulatory system: Secondary | ICD-10-CM | POA: Diagnosis not present

## 2024-06-21 DIAGNOSIS — G9341 Metabolic encephalopathy: Secondary | ICD-10-CM | POA: Diagnosis present

## 2024-06-21 LAB — ECHOCARDIOGRAM COMPLETE
Area-P 1/2: 3.08 cm2
Calc EF: 66.8 %
Height: 66 in
S' Lateral: 2.8 cm
Single Plane A2C EF: 74.2 %
Single Plane A4C EF: 62.6 %
Weight: 2400 [oz_av]

## 2024-06-21 LAB — COMPREHENSIVE METABOLIC PANEL WITH GFR
ALT: 20 U/L (ref 0–44)
AST: 41 U/L (ref 15–41)
Albumin: 3.4 g/dL — ABNORMAL LOW (ref 3.5–5.0)
Alkaline Phosphatase: 58 U/L (ref 38–126)
Anion gap: 10 (ref 5–15)
BUN: 23 mg/dL (ref 8–23)
CO2: 26 mmol/L (ref 22–32)
Calcium: 8.9 mg/dL (ref 8.9–10.3)
Chloride: 98 mmol/L (ref 98–111)
Creatinine, Ser: 0.85 mg/dL (ref 0.44–1.00)
GFR, Estimated: >60 mL/min
Glucose, Bld: 241 mg/dL — ABNORMAL HIGH (ref 70–99)
Potassium: 3.7 mmol/L (ref 3.5–5.1)
Sodium: 134 mmol/L — ABNORMAL LOW (ref 135–145)
Total Bilirubin: 0.5 mg/dL (ref 0.0–1.2)
Total Protein: 6.1 g/dL — ABNORMAL LOW (ref 6.5–8.1)

## 2024-06-21 LAB — CBC WITH DIFFERENTIAL/PLATELET
Abs Immature Granulocytes: 0.04 K/uL (ref 0.00–0.07)
Basophils Absolute: 0 K/uL (ref 0.0–0.1)
Basophils Relative: 0 %
Eosinophils Absolute: 0 K/uL (ref 0.0–0.5)
Eosinophils Relative: 0 %
HCT: 38.1 % (ref 36.0–46.0)
Hemoglobin: 12.6 g/dL (ref 12.0–15.0)
Immature Granulocytes: 0 %
Lymphocytes Relative: 13 %
Lymphs Abs: 1.4 K/uL (ref 0.7–4.0)
MCH: 28.6 pg (ref 26.0–34.0)
MCHC: 33.1 g/dL (ref 30.0–36.0)
MCV: 86.6 fL (ref 80.0–100.0)
Monocytes Absolute: 0.5 K/uL (ref 0.1–1.0)
Monocytes Relative: 4 %
Neutro Abs: 9.2 K/uL — ABNORMAL HIGH (ref 1.7–7.7)
Neutrophils Relative %: 83 %
Platelets: 145 K/uL — ABNORMAL LOW (ref 150–400)
RBC: 4.4 MIL/uL (ref 3.87–5.11)
RDW: 13.8 % (ref 11.5–15.5)
WBC: 11.1 K/uL — ABNORMAL HIGH (ref 4.0–10.5)
nRBC: 0 % (ref 0.0–0.2)

## 2024-06-21 LAB — GLUCOSE, CAPILLARY
Glucose-Capillary: 166 mg/dL — ABNORMAL HIGH (ref 70–99)
Glucose-Capillary: 104 mg/dL — ABNORMAL HIGH (ref 70–99)
Glucose-Capillary: 114 mg/dL — ABNORMAL HIGH (ref 70–99)
Glucose-Capillary: 125 mg/dL — ABNORMAL HIGH (ref 70–99)

## 2024-06-21 LAB — PHOSPHORUS: Phosphorus: 2.6 mg/dL (ref 2.5–4.6)

## 2024-06-21 LAB — PROCALCITONIN: Procalcitonin: 3.73 ng/mL

## 2024-06-21 LAB — LACTIC ACID, PLASMA
Lactic Acid, Venous: 2.3 mmol/L (ref 0.5–1.9)
Lactic Acid, Venous: 1.1 mmol/L (ref 0.5–1.9)

## 2024-06-21 LAB — TROPONIN T, HIGH SENSITIVITY: Troponin T High Sensitivity: 44 ng/L — ABNORMAL HIGH (ref 0–19)

## 2024-06-21 LAB — MAGNESIUM: Magnesium: 1.9 mg/dL (ref 1.7–2.4)

## 2024-06-21 MED ORDER — ROSUVASTATIN CALCIUM 10 MG PO TABS
20.0000 mg | ORAL_TABLET | Freq: Every day | ORAL | Status: DC
  Administered 2024-06-21 – 2024-06-23 (×3): 20 mg via ORAL
  Filled 2024-06-21 (×3): qty 2

## 2024-06-21 MED ORDER — LACTATED RINGERS IV BOLUS
1000.0000 mL | Freq: Once | INTRAVENOUS | Status: AC
  Administered 2024-06-21: 1000 mL via INTRAVENOUS

## 2024-06-21 MED ORDER — IPRATROPIUM-ALBUTEROL 0.5-2.5 (3) MG/3ML IN SOLN
3.0000 mL | Freq: Four times a day (QID) | RESPIRATORY_TRACT | Status: DC
  Administered 2024-06-22 (×2): 3 mL via RESPIRATORY_TRACT
  Filled 2024-06-21 (×4): qty 3

## 2024-06-21 MED ORDER — GUAIFENESIN ER 600 MG PO TB12
1200.0000 mg | ORAL_TABLET | Freq: Two times a day (BID) | ORAL | Status: DC
  Administered 2024-06-21 – 2024-06-23 (×4): 1200 mg via ORAL
  Filled 2024-06-21 (×4): qty 2

## 2024-06-21 NOTE — Plan of Care (Signed)
" °  Problem: Activity: Goal: Risk for activity intolerance will decrease Outcome: Progressing   Problem: Coping: Goal: Level of anxiety will decrease Outcome: Progressing   Problem: Pain Managment: Goal: General experience of comfort will improve and/or be controlled Outcome: Progressing   Problem: Safety: Goal: Ability to remain free from injury will improve Outcome: Progressing   Problem: Skin Integrity: Goal: Risk for impaired skin integrity will decrease Outcome: Progressing   Problem: Activity: Goal: Ability to tolerate increased activity will improve Outcome: Progressing   Problem: Coping: Goal: Ability to adjust to condition or change in health will improve Outcome: Progressing   "

## 2024-06-21 NOTE — Progress Notes (Signed)
 " PROGRESS NOTE    Destiny Decker  FMW:[id number] DOB: October 12, 1945 DOA: 06/20/2024 PCP: Merilee, L.Addie, MD (Inactive)   Brief Narrative:  Destiny Decker is a 78 y.o. female with medical history significant of essential hypertension, non-insulin -dependent DM type II, hyperlipidemia and hypothyroidism present emergency department complaining of productive cough for several days with associated generalized weakness.  At baseline  patient lives independently and has normal mental status. This morning she was extremely fatigued and weak. She did have a fall and hit her left posterior ribs on a door. She reports she has a little bit of pain in that area but not severe. She denies any pain with deep inspiration breathing or coughing. There has been no vomiting or diarrhea. Denies any fever and chills.   Admitted for Sepsis 2/2 LLL CAP and Metabolic Encephalopathy/Confusion in setting of Pneumonia and is improving slowly..   Assessment and Plan:  Severe Sepsis secondary to LLL Community Acquired Pneumonia -Presented to emergency department complaining of fever, chill, productive cough and confusion per patient husband for last few days.  At presentation to ED patient found febrile, borderline hypotensive and tachypneic and Lactic >2.0.  New recomment of oxygen 2 L O2 sat 90 to 96%.  However oxygen saturation never dropped below 88%. -In the setting of leukocytosis, elevated lactic acid, febrile and chest x-ray showed evidence for pneumonia patient meets sepsis criteria. -In the ED patient received IV Ceftriaxone  and Azithromycin  and 2.5 L of LR bolus and code sepsis has been activated -Continued maintenance fluid LR 150 mL/hr x 20 Hours; -Continue IV Ceftriaxone  and IV Doxycycline . -Respiratory Negative for COVID RSV flu.  Blood cultures are in process.  Checking sputum culture, urine Legionella, urine strep antigen and procalcitonin level. -Need to follow-up with culture result for antibiotic guidance. -C/w  Guaifenesin  1200 mg po BID, Flutter Valve, and Incentive Spirometry  -Add DuoNeb q6h -WBC went from 13.3 -> 11.1; PCT was 3.73 and LA went from 2.3 -> 1.1 -CXR today showed LLL Streaky opacity and was unchanged today -Continue supportive care and wean down oxygen to room air as patient tolerates. Will need PT/OT Evaluation and will need Ambulatory Home O2 Screen and repeat CXR in the AM  Metabolic Encephalopathy/Confusion in setting of Pneumonia: Improving and appears closer to baseline per Husband    Elevated Troponin 2/2 to demand ischemia in the context of pneumonia Elevated troponin 46.  Checking second troponin level.  Concern for elevated troponin in the setting of demand ischemia in the context of sepsis and pneumonia.  EKG showing ectopic atrial rhythm heart rate 96.  There is no history of abnormality.  At this time there is no concern for ACS.  Obtaining echocardiogram to assess for any wall motion abnormality.  Continue cardiac monitoring.  Hyponatremia: Mild and Improving. Na+ went from 133 -> 134. CTM and Replete a Necessary. Repeat CMP in the AM   Elevated BNP: Elevated BNP of 1,893.0.  No previous echocardiogram on the chart to assess baseline heart function.  Concern for elevated BNP in the setting of acute reactive process. Obtained ECHO and done and pending Read.    Left-Sided Lower Chest wall pain secondary to fall: Patient reported left-sided lower chest wall pain with coughing since the fall.  Chest x-ray no evidence of fracture or dislocation. Continue conservative management with Lidocaine  1 patch 5% TD daily, and Acetaminophen  650 mg po/RC q6h as needed for Mild Pain.  Continue IV Ketorolac  7.5-15 mg q6h as needed for severe pain control.  Essential HTN: Had Sepsis on Admission so held home Antihypertensives w/ Amlodipine  10 mg po Daily, hydrochlorothiazide 12.5 mg po Daily, Isosorbide 30 mg po Dailyprn, and Losartan  50 mg po Daily Held. CTM BP per Protocol. Last BP reading  was 119/55   HLD: C/w Rosuvastatin  20 mg po Daily and Omega-3 Acid Etyhl Esters 1 gram po BID  Non-Insulin  Dependent DM Type II: At home patient is on Jardiance 10 mg po Daily and Metformin 1000 mg po BID which are currently held. C/w Very Sensitive Novolog  SSI AC/HS. CTM CBGs per Protcol. CBG Trend:  Recent Labs  Lab 06/20/24 2138 06/21/24 0748 06/21/24 1224  GLUCAP 188* 166* 104*    Hypothyroidism: Check TSH in the AM. Continue Levothyroxine  75 mcg po Daily   Thrombocytopenia: Plt Count went from 191 -> 145. No longer taking her Clopidogrel. CTM for S/Sx of Bleeding; No overt bleeding noted. Repeat CBC in the AM  Hypoalbuminemia: Patient's Albumin Lvl went from 4.5 -> 3.4. CTM & Trend & repeat CMP in the AM   DVT prophylaxis: enoxaparin  (LOVENOX ) injection 40 mg Start: 06/21/24 1000 SCDs Start: 06/20/24 2146 Place TED hose Start: 06/20/24 2146    Code Status: Full Code Family Communication: D/w Husband @ bedside   Disposition Plan:  Level of care: Med-Surg Status is: Inpatient Remains inpatient appropriate because: Needs further clinical improvement and evaluation by PT/OT   Consultants:  None  Procedures:  As delineated as above  Antimicrobials:  Anti-infectives (From admission, onward)    Start     Dose/Rate Route Frequency Ordered Stop   06/21/24 1000  cefTRIAXone  (ROCEPHIN ) 2 g in sodium chloride  0.9 % 100 mL IVPB        2 g 200 mL/hr over 30 Minutes Intravenous Every 24 hours 06/20/24 2140 06/27/24 0959   06/20/24 2230  doxycycline  (VIBRAMYCIN ) 100 mg in sodium chloride  0.9 % 250 mL IVPB        100 mg 125 mL/hr over 120 Minutes Intravenous Every 12 hours 06/20/24 2140 06/27/24 2229   06/20/24 1915  cefTRIAXone  (ROCEPHIN ) 1 g in sodium chloride  0.9 % 100 mL IVPB        1 g 200 mL/hr over 30 Minutes Intravenous  Once 06/20/24 1903 06/20/24 2054   06/20/24 1915  azithromycin  (ZITHROMAX ) 500 mg in sodium chloride  0.9 % 250 mL IVPB        500 mg 250 mL/hr over 60  Minutes Intravenous  Once 06/20/24 1903 06/20/24 2142       Subjective: Seen and examined at bedside and mentation is doing much better.  Thinks she is doing better and not really coughing up very much at all.  Denies any lightheadedness or dizziness.  No other concerns or complaints at this time  Objective: Vitals:   06/21/24 0214 06/21/24 0534 06/21/24 1002 06/21/24 1333  BP: (!) 135/59 125/63 120/71 (!) 119/55  Pulse: 72 60 61 62  Resp: 20 20 16 16   Temp: (!) 100.9 F (38.3 C) 98.5 F (36.9 C) 98.2 F (36.8 C) 98.9 F (37.2 C)  TempSrc: Oral Oral Oral   SpO2: 93% 95% 95% 96%  Weight:      Height:        Intake/Output Summary (Last 24 hours) at 06/21/2024 1634 Last data filed at 06/21/2024 1200 Gross per 24 hour  Intake 844.93 ml  Output 520 ml  Net 324.93 ml   Filed Weights   06/20/24 1659  Weight: 68 kg   Examination: Physical Exam:  Constitutional:  WN/WD early Caucasian female no acute Respiratory: Diminished to auscultation bilaterally with some coarse breath sounds worse on the left compared to the right with slight rhonchi but no appreciable rales, wheezing or crackles. Normal respiratory effort and patient is not tachypenic. No accessory muscle use but is wearing supplemental oxygen via nasal cannula Cardiovascular: RRR, no murmurs / rubs / gallops. S1 and S2 auscultated. No appreciable extremity edema.  Abdomen: Soft, non-tender, non-distended. Bowel sounds positive.  GU: Deferred. Musculoskeletal: No clubbing / cyanosis of digits/nails. No joint deformity upper and lower extremities.  Skin: No rashes, lesions, ulcers on limited skin evaluation. No induration; Warm and dry.  Neurologic: CN 2-12 grossly intact with no focal deficits. Romberg sign and cerebellar reflexes not assessed.  Psychiatric: Normal judgment and insight. Alert and oriented x 3. Normal mood and appropriate affect.   Data Reviewed: I have personally reviewed following labs and imaging  studies  CBC: Recent Labs  Lab 06/20/24 1703 06/21/24 0846  WBC 13.3* 11.1*  NEUTROABS  --  9.2*  HGB 15.8* 12.6  HCT 47.2* 38.1  MCV 85.4 86.6  PLT 191 145*   Basic Metabolic Panel: Recent Labs  Lab 06/20/24 1703 06/21/24 0846  NA 133* 134*  K 3.8 3.7  CL 91* 98  CO2 25 26  GLUCOSE 187* 241*  BUN 18 23  CREATININE 0.97 0.85  CALCIUM  9.2 8.9  MG  --  1.9  PHOS  --  2.6   GFR: Estimated Creatinine Clearance: 50.2 mL/min (by C-G formula based on SCr of 0.85 mg/dL). Liver Function Tests: Recent Labs  Lab 06/20/24 1703 06/21/24 0846  AST 37 41  ALT 20 20  ALKPHOS 57 58  BILITOT 0.6 0.5  PROT 7.7 6.1*  ALBUMIN 4.5 3.4*   No results for input(s): LIPASE, AMYLASE in the last 168 hours. No results for input(s): AMMONIA in the last 168 hours. Coagulation Profile: No results for input(s): INR, PROTIME in the last 168 hours. Cardiac Enzymes: No results for input(s): CKTOTAL, CKMB, CKMBINDEX, TROPONINI in the last 168 hours. BNP (last 3 results) Recent Labs    06/20/24 1925  PROBNP 1,893.0*   HbA1C: No results for input(s): HGBA1C in the last 72 hours. CBG: Recent Labs  Lab 06/20/24 2138 06/21/24 0748 06/21/24 1224  GLUCAP 188* 166* 104*   Lipid Profile: No results for input(s): CHOL, HDL, LDLCALC, TRIG, CHOLHDL, LDLDIRECT in the last 72 hours. Thyroid  Function Tests: No results for input(s): TSH, T4TOTAL, FREET4, T3FREE, THYROIDAB in the last 72 hours. Anemia Panel: No results for input(s): VITAMINB12, FOLATE, FERRITIN, TIBC, IRON, RETICCTPCT in the last 72 hours. Sepsis Labs: Recent Labs  Lab 06/20/24 1925 06/20/24 2221 06/21/24 0117  PROCALCITON  --  3.73  --   LATICACIDVEN 2.3* 2.3* 1.1   Recent Results (from the past 240 hours)  Resp panel by RT-PCR (RSV, Flu A&B, Covid) Anterior Nasal Swab     Status: None   Collection Time: 06/20/24  5:02 PM   Specimen: Anterior Nasal Swab  Result  Value Ref Range Status   SARS Coronavirus 2 by RT PCR NEGATIVE NEGATIVE Final    Comment: (NOTE) SARS-CoV-2 target nucleic acids are NOT DETECTED.  The SARS-CoV-2 RNA is generally detectable in upper respiratory specimens during the acute phase of infection. The lowest concentration of SARS-CoV-2 viral copies this assay can detect is 138 copies/mL. A negative result does not preclude SARS-Cov-2 infection and should not be used as the sole basis for treatment or other patient management  decisions. A negative result may occur with  improper specimen collection/handling, submission of specimen other than nasopharyngeal swab, presence of viral mutation(s) within the areas targeted by this assay, and inadequate number of viral copies(<138 copies/mL). A negative result must be combined with clinical observations, patient history, and epidemiological information. The expected result is Negative.  Fact Sheet for Patients:  bloggercourse.com  Fact Sheet for Healthcare Providers:  seriousbroker.it  This test is no t yet approved or cleared by the [place]  FDA and  has been authorized for detection and/or diagnosis of SARS-CoV-2 by FDA under an Emergency Use Authorization (EUA). This EUA will remain  in effect (meaning this test can be used) for the duration of the COVID-19 declaration under Section 564(b)(1) of the Act, 21 U.S.C.section 360bbb-3(b)(1), unless the authorization is terminated  or revoked sooner.       Influenza A by PCR NEGATIVE NEGATIVE Final   Influenza B by PCR NEGATIVE NEGATIVE Final    Comment: (NOTE) The Xpert Xpress SARS-CoV-2/FLU/RSV plus assay is intended as an aid in the diagnosis of influenza from Nasopharyngeal swab specimens and should not be used as a sole basis for treatment. Nasal washings and aspirates are unacceptable for Xpert Xpress SARS-CoV-2/FLU/RSV testing.  Fact Sheet for  Patients: bloggercourse.com  Fact Sheet for Healthcare Providers: seriousbroker.it  This test is not yet approved or cleared by the [place]  FDA and has been authorized for detection and/or diagnosis of SARS-CoV-2 by FDA under an Emergency Use Authorization (EUA). This EUA will remain in effect (meaning this test can be used) for the duration of the COVID-19 declaration under Section 564(b)(1) of the Act, 21 U.S.C. section 360bbb-3(b)(1), unless the authorization is terminated or revoked.     Resp Syncytial Virus by PCR NEGATIVE NEGATIVE Final    Comment: (NOTE) Fact Sheet for Patients: bloggercourse.com  Fact Sheet for Healthcare Providers: seriousbroker.it  This test is not yet approved or cleared by the [place]  FDA and has been authorized for detection and/or diagnosis of SARS-CoV-2 by FDA under an Emergency Use Authorization (EUA). This EUA will remain in effect (meaning this test can be used) for the duration of the COVID-19 declaration under Section 564(b)(1) of the Act, 21 U.S.C. section 360bbb-3(b)(1), unless the authorization is terminated or revoked.  Performed at [facility], [address] [place], [place] [address]   Culture, blood (routine x 2)     Status: None (Preliminary result)   Collection Time: 06/20/24  7:03 PM   Specimen: BLOOD  Result Value Ref Range Status   Specimen Description BLOOD LEFT ANTECUBITAL  Final   Special Requests   Final    BOTTLES DRAWN AEROBIC AND ANAEROBIC Blood Culture adequate volume   Culture   Final    NO GROWTH < 12 HOURS Performed at [facility], [address], [place], [place] [address]    Report Status PENDING  Incomplete  Culture, blood (routine x 2)     Status: None (Preliminary result)   Collection Time: 06/20/24  8:00 PM   Specimen: BLOOD  Result Value Ref Range Status   Specimen  Description BLOOD SITE NOT SPECIFIED  Final   Special Requests   Final    BOTTLES DRAWN AEROBIC AND ANAEROBIC Blood Culture adequate volume   Culture   Final    NO GROWTH < 12 HOURS Performed at [facility], [address] [place], [place] [address]    Report Status PENDING  Incomplete  Radiology Studies: DG CHEST PORT 1 VIEW Result Date: 06/21/2024 EXAM: 1 VIEW(S) XRAY OF THE CHEST 06/21/2024 10:00:56 AM COMPARISON: 06/20/2024 CLINICAL HISTORY: SOB (shortness of breath) FINDINGS: LUNGS AND PLEURA: Streaky left lung base opacity, overall similar to prior. No pleural effusion. No pneumothorax. HEART AND MEDIASTINUM: Aortic atherosclerosis. No acute abnormality of the cardiac and mediastinal silhouettes. BONES AND SOFT TISSUES: No acute osseous abnormality. IMPRESSION: 1. Unchanged left basilar opacity. Electronically signed by: Michaeline Blanch MD 06/21/2024 01:24 PM EST RP Workstation: [id number]   DG Chest 2 View Result Date: 06/20/2024 CLINICAL DATA:  Productive cough. EXAM: CHEST - 2 VIEW COMPARISON:  04/26/2006 FINDINGS: Patchy left lower lobe opacity suspicious for pneumonia. The heart is normal in size. Mediastinal contours are normal. No pulmonary edema, pleural effusion, or pneumothorax. No acute osseous findings. IMPRESSION: Patchy left lower lobe opacity suspicious for pneumonia. Electronically Signed   By: Andrea Gasman M.D.   On: 06/20/2024 18:56   Scheduled Meds:  aspirin   81 mg Oral Daily   cholecalciferol   1,000 Units Oral q morning   docusate sodium   100 mg Oral BID   enoxaparin  (LOVENOX ) injection  40 mg Subcutaneous Q24H   guaiFENesin   1,200 mg Oral BID   insulin  aspart  0-5 Units Subcutaneous QHS   insulin  aspart  0-6 Units Subcutaneous TID WC   ipratropium-albuterol   3 mL Nebulization Q6H   levothyroxine   75 mcg Oral Q0600   lidocaine   1 patch Transdermal Q24H   omega-3 acid ethyl esters  1 g Oral BID   rosuvastatin   20 mg Oral Daily   sodium chloride  flush   3 mL Intravenous Q12H   Continuous Infusions:  sodium chloride      cefTRIAXone  (ROCEPHIN )  IV 2 g (06/21/24 0848)   doxycycline  (VIBRAMYCIN ) IV 100 mg (06/21/24 1009)    LOS: 0 days   Alejandro Marker, DO [facility] Available via Epic secure chat 7am-7pm After these hours, please refer to coverage provider listed on [email or web address] 06/21/2024, 4:34 PM  "

## 2024-06-21 NOTE — Hospital Course (Addendum)
 Destiny Decker is a 78 y.o. female with medical history significant of essential hypertension, non-insulin -dependent DM type II, hyperlipidemia and hypothyroidism present emergency department complaining of productive cough for several days with associated generalized weakness.  At baseline  patient lives independently and has normal mental status. This morning she was extremely fatigued and weak. She did have a fall and hit her left posterior ribs on a door. She reports she has a little bit of pain in that area but not severe. She denies any pain with deep inspiration breathing or coughing. There has been no vomiting or diarrhea. Denies any fever and chills.   Admitted for Sepsis 2/2 LLL CAP and Metabolic Encephalopathy/Confusion in setting of Pneumonia and is improving slowly. Appeared a little volume overloaded and had some Pulmonary Vascular congestion so will give her a dose of IV Furosemide  and she was given another 1 prior to discharge.  She improved and did not desaturate and is medically stable for discharge only to follow with PCP and cardiology outpatient setting repeat chest x-ray to 6 weeks.  Assessment and Plan:  Severe Sepsis secondary to LLL Community Acquired Pneumonia -Presented to emergency department complaining of fever, chill, productive cough and confusion per patient husband for last few days.  At presentation to ED patient found febrile, borderline hypotensive and tachypneic and Lactic >2.0.  New recomment of oxygen 2 L O2 sat 90 to 96%.  However oxygen saturation never dropped below 88%. -In the setting of leukocytosis, elevated lactic acid, febrile and chest x-ray showed evidence for pneumonia patient meets sepsis criteria. -In the ED patient received IV Ceftriaxone  and Azithromycin  and 2.5 L of LR bolus and code sepsis was activated -IVF w/ LR 150 mL/hr x 20 Hours now stopped. Given a dose of IV Furosemide  given a little volume overloaded and also due to CXR findings today   -Continued IV Ceftriaxone  and IV Doxycycline  and changed to p.o. at the time of discharge -Respiratory Negative for COVID RSV flu.  Blood cultures are in process.  Checking sputum culture, urine Legionella, urine strep antigen and procalcitonin level. -Need to follow-up with culture result for antibiotic guidance. -C/w Guaifenesin  1200 mg po BID, Flutter Valve, and Incentive Spirometry  -Add DuoNeb q6h -WBC went from 13.3 -> 11.1 -> 6.2 and is now 5.2; PCT was 3.73 and LA went from 2.3 -> 1.1 -CXR on admission showed LLL Streaky opacity and was unchanged yesterday but CXR today showed Pulmonary vascular congestion without overt pulmonary airspace edema. -Continue supportive care and wean down oxygen to room air as patient tolerates. PT/OT Evaluation recommending HH/OT; -Ambulatory Home O2 Screen and improved and did not desaturate; repeat chest x-ray below -Follow-up with PCP within 1 to 2 weeks repeat chest x-ray in 3 to 6 weeks  Metabolic Encephalopathy/Confusion in setting of Pneumonia: Improving and about baseline. Delirium Precautions   Elevated Troponin 2/2 to demand ischemia in the context of pneumonia: Elevated troponin 46.  Checking second troponin level.  Concern for elevated troponin in the setting of demand ischemia in the context of sepsis and pneumonia.  EKG showing ectopic atrial rhythm heart rate 96.  There is no history of abnormality.  At this time there is no concern for ACS.  Obtained Echocardiogram to assess for any wall motion abnormality.  Continue cardiac monitoring.  Follow-up with cardiology outpatient setting  Hyponatremia: Mild and Improving. Na+ went from 133 -> 134 -> 139 and is now 140 the time of discharge. CTM and Replete a Necessary. Repeat CMP  in the AM   Elevated BNP in the setting of Acute Diastolic Grade 2 CHF: Elevated BNP of 1,893.0 and likely worsened with all the IVF she got for Sepsis as above.  No previous echocardiogram in our system to assess baseline  heart function but she does see Dr. Meldon of [facility]. Concern for elevated BNP in the setting of acute reactive process. Obtained ECHO and showed EF of 60-65% w/ No RWMA, mild concentric LVH, G2DD, and a RVSF that was low normal. Her LA, and RA were moderately dilated and noted to have severe Severe Tricuspid Stenosis. Strict I's and O's and Daily Weights. Will give a dose of IV Furosemide  40 mg x1 again prior to discharge.  Repeat chest x-ray done and showed Small scattered opacities in the left base, possibly atelectasis or pneumonia. Mild cardiomegaly with central vascular prominence and no overt edema. -Ambulatory home O2 screen done and she did not desaturate -Will need F/U with Cardiology @ D/C  Tricuspid Stenosis: Severe on ECHO. Will need outpt Cardiology follow up and monitoring. She sees Dr. Meldon in [facility] and will need to F/U in the outpatient setting    Left-Sided Lower Chest wall pain secondary to fall: Patient reported left-sided lower chest wall pain with coughing since the fall.  Chest x-ray no evidence of fracture or dislocation. Continue conservative management with Lidocaine  1 patch 5% TD daily, and Acetaminophen  650 mg po/RC q6h as needed for Mild Pain.  Continue IV Ketorolac  7.5-15 mg q6h as needed for severe pain control.  This is improved  Essential HTN: Had Sepsis on Admission so held home Antihypertensives w/ Amlodipine  10 mg po Daily, Hydrochlorothiazide 12.5 mg po Daily, Isosorbide 30 mg po Dailyprn, and Losartan  50 mg po Daily but will resume Losartan  and Amlodipine  today and likely the Imdur and Hydrochlorothiazide in the AM. CTM BP per Protocol. Last BP reading was 145/73   HLD: C/w Rosuvastatin  20 mg po Daily and Omega-3 Acid Etyhl Esters 1 gram po BID  Hypophosphatemia: Phos Level is now 2.8. CTM and Repelte as Necessary. Repeat Phos Level in the AM   Non-Insulin  Dependent DM Type II: At home patient is on Jardiance 10 mg po Daily and  Metformin 1000 mg po BID which are currently held. C/w Very Sensitive Novolog  SSI AC/HS. CTM CBGs per Protcol. CBG Trend ranging from 158-271 the last 7 checks   Hypothyroidism: Check TSH in the AM. Continue Levothyroxine  75 mcg po Daily   Thrombocytopenia: Plt Count went from 191 -> 145 -> 137 and is now 152 at the time of this. No longer taking her Clopidogrel per MAR. CTM for S/Sx of Bleeding; No overt bleeding noted. Repeat CBC in the AM  Hypoalbuminemia: Patient's Albumin Lvl went from 4.5 -> 3.4 -> 3.3. CTM & Trend & repeat CMP in the AM

## 2024-06-21 NOTE — Progress Notes (Signed)
 Mobility Specialist Progress Note:  RA Pre-mobility: 94% SpO2 During mobility: 90-93% SpO2 Post-mobility: 92% SPO2 (after 1 min sitting rest)   06/21/24 1316  Mobility  Activity Ambulated with assistance  Level of Assistance Standby assist, set-up cues, supervision of patient - no hands on  Assistive Device Front wheel walker  Distance Ambulated (ft) 70 ft  Activity Response Tolerated fair  Mobility Referral Yes  Mobility visit 1 Mobility  Mobility Specialist Start Time (ACUTE ONLY) 1251  Mobility Specialist Stop Time (ACUTE ONLY) 1307  Mobility Specialist Time Calculation (min) (ACUTE ONLY) 16 min   Pt was received in bed and agreeable to O2 test:  Nurse requested Mobility Specialist to perform oxygen saturation test with pt which includes removing pt from oxygen both at rest and while ambulating.  Below are the results from that testing.     Patient Saturations on Room Air at Rest = spO2 94%  Patient Saturations on Room Air while Ambulating = sp02 90-93% .    At end of testing pt left in room on room air, spO2 92%  Pt stated pain in lower back, deferred further mobility.  Returned to bed with all needs met. Call bell in reach.  Reported results to nurse.     [employer] - Mobility Specialist

## 2024-06-21 NOTE — Plan of Care (Signed)

## 2024-06-21 NOTE — Progress Notes (Signed)
" °   06/21/24 0915  TOC Brief Assessment  Insurance and Status Reviewed  Patient has primary care physician Yes  Home environment has been reviewed single family home  Prior level of function: independent  Prior/Current Home Services No current home services  Social Drivers of Health Review SDOH reviewed no interventions necessary  Readmission risk has been reviewed Yes  Transition of care needs transition of care needs identified, TOC will continue to follow    Signed: Heather Saltness, MSW, LCSW Clinical Social Worker Inpatient Care Management 06/21/2024 9:15 AM   "

## 2024-06-21 NOTE — Evaluation (Signed)
 Clinical/Bedside Swallow Evaluation Patient Details  Name: Destiny Decker MRN: [id number] Date of Birth: [birth year]  Today's Date: 06/21/2024 Time: SLP Start Time (ACUTE ONLY): 1022 SLP Stop Time (ACUTE ONLY): 1032 SLP Time Calculation (min) (ACUTE ONLY): 10 min  Past Medical History:  Past Medical History:  Diagnosis Date   Anal squamous cell carcinoma (HCC)    Complication of anesthesia    HARD TO WAKE   Diabetes mellitus, type 2 (HCC)    Hemorrhoids    Hypertension    Hypothyroidism    Mild acid reflux    Nocturia    Past Surgical History:  Past Surgical History:  Procedure Laterality Date   APPENDECTOMY  AGE 33   EXAMINATION UNDER ANESTHESIA N/A 01/04/2013   Procedure: EXAM UNDER ANESTHESIA;  Surgeon: Bernarda Ned, MD;  Location: [facility];  Service: General;  Laterality: N/A;   LEFT KNEE ARTHROSCOPY W/ DEBRIDEMENT AND REMOVAL GANGLION CYST  05-17-2008   TRANSANAL EXCISION OF RECTAL MASS N/A 09/12/2012   Procedure: TRANSANAL EXCISION OF RECTAL MASS;  Surgeon: Bernarda Ned, MD;  Location: [facility] [place];  Service: General;  Laterality: N/A;   WISDOM TOOTH EXTRACTION     HPI:  Destiny Decker is a 78 y.o. female who presented to [facility] on 06/20/24 secondary to a productive cough that started two days prior to admission. In addition, her spouse was concerned that she was confused which is not her baseline. In addition, he reported that patient fell on day of admission, reporting low back pain but denies head injury. CXR showed left LL infiltrate suspicious for PNA. She was admitted for sepsis in the setting of PNA. SLP swallow evaluation ordered to assess her risk for aspiration secondary to PNA. PMH: essential HTN, HLD, hypothryoidism, NIDDM-2.    Assessment / Plan / Recommendation  Clinical Impression  SLP not recommending further skilled intervention at this time.  If any further concerns for aspiration PNA, please order MBS. (Modified barium swallow  study)  Patient is not currently presenting with clinical s/s of dysphagia as per this bedside swallow evaluation. Per chart review and per patient's report, she has no h/o oropharyngeal or esophageal dysphagia. Patient did exhibit dry sounding, non-productive cough prior to PO's and intermittently throughout session, however cough did not appear correlated with PO intake.  SLP Visit Diagnosis: Dysphagia, unspecified (R13.10)    Aspiration Risk  No limitations    Diet Recommendation Regular;Thin liquid    Liquid Administration via: Cup;Straw Medication Administration: Other (Comment) (as tolerated) Supervision: Patient able to self feed Compensations: Slow rate;Small sips/bites Postural Changes: Seated upright at 90 degrees    Other Recommendations Oral Care Recommendations: Oral care BID     Swallow Evaluation Recommendations     Assistance Recommended at Discharge    Functional Status Assessment Patient has not had a recent decline in their functional status  Frequency and Duration            Prognosis        Swallow Study   General Date of Onset: 06/21/24 HPI: Destiny Decker is a 78 y.o. female who presented to [facility] on 06/20/24 secondary to a productive cough that started two days prior to admission. In addition, her spouse was concerned that she was confused which is not her baseline. In addition, he reported that patient fell on day of admission, reporting low back pain but denies head injury. CXR showed left LL infiltrate suspicious for PNA. She was admitted for sepsis in the setting of PNA.  SLP swallow evaluation ordered to assess her risk for aspiration secondary to PNA. PMH: essential HTN, HLD, hypothryoidism, NIDDM-2. Type of Study: Bedside Swallow Evaluation Previous Swallow Assessment: none found Diet Prior to this Study: Regular;Thin liquids (Level 0) Temperature Spikes Noted: No Respiratory Status: Nasal cannula History of Recent Intubation: No Behavior/Cognition:  Alert;Cooperative;Pleasant mood Oral Cavity Assessment: Within Functional Limits Oral Care Completed by SLP: No Oral Cavity - Dentition: Adequate natural dentition Vision: Functional for self-feeding Self-Feeding Abilities: Able to feed self Patient Positioning: Upright in bed Baseline Vocal Quality: Other (comment) (mild hoarseness) Volitional Cough: Strong Volitional Swallow: Able to elicit    Oral/Motor/Sensory Function Overall Oral Motor/Sensory Function: Within functional limits   Ice Chips     Thin Liquid Thin Liquid: Within functional limits Presentation: Self Fed;Straw    Nectar Thick     Honey Thick     Puree Puree: Not tested   Solid     Solid: Not tested      Destiny IVAR Blase, MA, CCC-SLP Speech Therapy  06/21/2024,12:03 PM

## 2024-06-21 NOTE — Progress Notes (Signed)
" °  Echocardiogram 2D Echocardiogram has been performed.  Devora Ellouise SAUNDERS 06/21/2024, 3:00 PM "

## 2024-06-22 ENCOUNTER — Inpatient Hospital Stay (HOSPITAL_COMMUNITY)

## 2024-06-22 DIAGNOSIS — I10 Essential (primary) hypertension: Secondary | ICD-10-CM | POA: Diagnosis not present

## 2024-06-22 DIAGNOSIS — G9341 Metabolic encephalopathy: Secondary | ICD-10-CM | POA: Diagnosis not present

## 2024-06-22 DIAGNOSIS — I5031 Acute diastolic (congestive) heart failure: Secondary | ICD-10-CM

## 2024-06-22 DIAGNOSIS — J189 Pneumonia, unspecified organism: Secondary | ICD-10-CM | POA: Diagnosis not present

## 2024-06-22 DIAGNOSIS — R7989 Other specified abnormal findings of blood chemistry: Secondary | ICD-10-CM | POA: Diagnosis not present

## 2024-06-22 LAB — CBC WITH DIFFERENTIAL/PLATELET
Abs Immature Granulocytes: 0.01 K/uL (ref 0.00–0.07)
Basophils Absolute: 0 K/uL (ref 0.0–0.1)
Basophils Relative: 0 %
Eosinophils Absolute: 0 K/uL (ref 0.0–0.5)
Eosinophils Relative: 1 %
HCT: 36.2 % (ref 36.0–46.0)
Hemoglobin: 12.1 g/dL (ref 12.0–15.0)
Immature Granulocytes: 0 %
Lymphocytes Relative: 20 %
Lymphs Abs: 1.2 K/uL (ref 0.7–4.0)
MCH: 28.9 pg (ref 26.0–34.0)
MCHC: 33.4 g/dL (ref 30.0–36.0)
MCV: 86.4 fL (ref 80.0–100.0)
Monocytes Absolute: 0.4 K/uL (ref 0.1–1.0)
Monocytes Relative: 6 %
Neutro Abs: 4.6 K/uL (ref 1.7–7.7)
Neutrophils Relative %: 73 %
Platelets: 137 K/uL — ABNORMAL LOW (ref 150–400)
RBC: 4.19 MIL/uL (ref 3.87–5.11)
RDW: 13.8 % (ref 11.5–15.5)
WBC: 6.2 K/uL (ref 4.0–10.5)
nRBC: 0 % (ref 0.0–0.2)

## 2024-06-22 LAB — COMPREHENSIVE METABOLIC PANEL WITH GFR
ALT: 20 U/L (ref 0–44)
AST: 38 U/L (ref 15–41)
Albumin: 3.3 g/dL — ABNORMAL LOW (ref 3.5–5.0)
Alkaline Phosphatase: 50 U/L (ref 38–126)
Anion gap: 9 (ref 5–15)
BUN: 18 mg/dL (ref 8–23)
CO2: 27 mmol/L (ref 22–32)
Calcium: 8.6 mg/dL — ABNORMAL LOW (ref 8.9–10.3)
Chloride: 103 mmol/L (ref 98–111)
Creatinine, Ser: 0.55 mg/dL (ref 0.44–1.00)
GFR, Estimated: >60 mL/min
Glucose, Bld: 120 mg/dL — ABNORMAL HIGH (ref 70–99)
Potassium: 3.7 mmol/L (ref 3.5–5.1)
Sodium: 139 mmol/L (ref 135–145)
Total Bilirubin: 0.3 mg/dL (ref 0.0–1.2)
Total Protein: 6 g/dL — ABNORMAL LOW (ref 6.5–8.1)

## 2024-06-22 LAB — GLUCOSE, CAPILLARY
Glucose-Capillary: 125 mg/dL — ABNORMAL HIGH (ref 70–99)
Glucose-Capillary: 144 mg/dL — ABNORMAL HIGH (ref 70–99)
Glucose-Capillary: 153 mg/dL — ABNORMAL HIGH (ref 70–99)
Glucose-Capillary: 158 mg/dL — ABNORMAL HIGH (ref 70–99)

## 2024-06-22 LAB — MAGNESIUM: Magnesium: 2 mg/dL (ref 1.7–2.4)

## 2024-06-22 LAB — PHOSPHORUS: Phosphorus: 1.9 mg/dL — ABNORMAL LOW (ref 2.5–4.6)

## 2024-06-22 MED ORDER — K PHOS MONO-SOD PHOS DI & MONO 155-852-130 MG PO TABS
500.0000 mg | ORAL_TABLET | Freq: Two times a day (BID) | ORAL | Status: AC
  Administered 2024-06-22 (×2): 500 mg via ORAL
  Filled 2024-06-22 (×2): qty 2

## 2024-06-22 MED ORDER — AMLODIPINE BESYLATE 10 MG PO TABS
10.0000 mg | ORAL_TABLET | Freq: Every day | ORAL | Status: DC
  Administered 2024-06-22 – 2024-06-23 (×2): 10 mg via ORAL
  Filled 2024-06-22 (×2): qty 1

## 2024-06-22 MED ORDER — FUROSEMIDE 10 MG/ML IJ SOLN
40.0000 mg | Freq: Once | INTRAMUSCULAR | Status: AC
  Administered 2024-06-22: 40 mg via INTRAVENOUS
  Filled 2024-06-22: qty 4

## 2024-06-22 MED ORDER — LOSARTAN POTASSIUM 50 MG PO TABS
50.0000 mg | ORAL_TABLET | Freq: Every day | ORAL | Status: DC
  Administered 2024-06-22 – 2024-06-23 (×2): 50 mg via ORAL
  Filled 2024-06-22 (×2): qty 1

## 2024-06-22 MED ORDER — IPRATROPIUM-ALBUTEROL 0.5-2.5 (3) MG/3ML IN SOLN
3.0000 mL | Freq: Two times a day (BID) | RESPIRATORY_TRACT | Status: DC
  Administered 2024-06-23: 3 mL via RESPIRATORY_TRACT
  Filled 2024-06-22: qty 3

## 2024-06-22 NOTE — Evaluation (Addendum)
 Occupational Therapy Evaluation Patient Details Name: Destiny Decker MRN: [id number] DOB: 1945-11-04 Today's Date: 06/22/2024   History of Present Illness   Patient is a 78y.o. female admitted on 12/23 dur to productive dough for several days and generalized weakness. PMH: HTN, non-insulin  dependent Dm, hyperlipidemia, and hypothyroidism. Chest xray showed pulmonary vascular congestion without overt pulmonary airspace edema.     Clinical Impressions PTA, pt living with spouse and reports being independent in ADL, IADl, driving. Upon eval, pt with slowed processing speed, decreased executive function, and limited activity tolerance. Unsure if cognitive assessment limited by any language barriers; denied need for interpreter and seems fluent in English language. Pt currently needing up to CGA for BADL and short distance mobility, but standing tolerance limited due to poor endurance. OT to continue to follow. Session conducted on RA with VSS. Pt to continue to benefit from acute OT services. Recommending discharge home with HHOT and support from family.      If plan is discharge home, recommend the following:   A little help with walking and/or transfers;A little help with bathing/dressing/bathroom;Assistance with cooking/housework;Assist for transportation;Help with stairs or ramp for entrance     Functional Status Assessment   Patient has had a recent decline in their functional status and demonstrates the ability to make significant improvements in function in a reasonable and predictable amount of time.     Equipment Recommendations   BSC/3in1     Recommendations for Other Services   Speech consult     Precautions/Restrictions   Precautions Precautions: None Recall of Precautions/Restrictions: Intact Restrictions Weight Bearing Restrictions Per Provider Order: No     Mobility Bed Mobility               General bed mobility comments: seated in recliner upon PT  arrival    Transfers Overall transfer level: Needs assistance Equipment used: 1 person hand held assist Transfers: Sit to/from Stand Sit to Stand: Contact guard assist           General transfer comment: increased pain when powering up to stand      Balance Overall balance assessment: Needs assistance Sitting-balance support: Feet supported Sitting balance-Leahy Scale: Good     Standing balance support: Bilateral upper extremity supported, During functional activity Standing balance-Leahy Scale: Poor Standing balance comment: balance is impact by elevated pain levels                           ADL either performed or assessed with clinical judgement   ADL Overall ADL's : Needs assistance/impaired Eating/Feeding: Independent;Sitting   Grooming: Brushing hair;Sitting;Set up   Upper Body Bathing: Set up;Sitting   Lower Body Bathing: Contact guard assist;Sit to/from stand   Upper Body Dressing : Set up;Sitting   Lower Body Dressing: Contact guard assist;Sit to/from stand   Toilet Transfer: Contact guard assist Toilet Transfer Details (indicate cue type and reason): fatigues quickly         Functional mobility during ADLs: Contact guard assist       Vision Patient Visual Report: No change from baseline       Perception         Praxis         Pertinent Vitals/Pain Pain Assessment Pain Assessment: Faces Faces Pain Scale: Hurts even more Pain Location: left flank, back from fall Pain Descriptors / Indicators: Discomfort, Aching, Grimacing Pain Intervention(s): Limited activity within patient's tolerance, Monitored during session     Extremity/Trunk Assessment  Upper Extremity Assessment Upper Extremity Assessment: Generalized weakness   Lower Extremity Assessment Lower Extremity Assessment: Generalized weakness   Cervical / Trunk Assessment Cervical / Trunk Assessment: Normal   Communication Communication Communication: No apparent  difficulties   Cognition Arousal: Alert Behavior During Therapy: WFL for tasks assessed/performed Cognition: Cognition impaired, No family/caregiver present to determine baseline   Orientation impairments:  (questionable needing incr time to answer vs word finding difficulty. initially reports it is january but self corrects quickly and then initially reports 1925 but self corrects quickly) Awareness: Online awareness impaired   Attention impairment (select first level of impairment): Selective attention, Sustained attention Executive functioning impairment (select all impairments): Organization, Sequencing, Problem solving                   Following commands: Intact (for 1-2 step command)       Cueing  General Comments   Cueing Techniques: Verbal cues;Tactile cues      Exercises     Shoulder Instructions      Home Living Family/patient expects to be discharged to:: Private residence Living Arrangements: Spouse/significant other Available Help at Discharge: Family Type of Home: House Home Access: Level entry     Home Layout: Two level;Laundry or work area in basement emergency planning/management officer in basement) Alternate Teacher, Music of Steps: flight Alternate Level Stairs-Rails: Right Bathroom Shower/Tub: Theme Park Manager: Yes   Home Equipment: Rollator (4 wheels)          Prior Functioning/Environment Prior Level of Function : Independent/Modified Independent;Driving             Mobility Comments: mobility without device, drives ADLs Comments: IND ADLs, IADLs    OT Problem List: Decreased strength;Decreased activity tolerance;Impaired balance (sitting and/or standing);Decreased cognition;Decreased safety awareness   OT Treatment/Interventions: Self-care/ADL training;Therapeutic exercise;DME and/or AE instruction;Energy conservation;Therapeutic activities;Patient/family education;Balance training;Cognitive  remediation/compensation      OT Goals(Current goals can be found in the care plan section)   Acute Rehab OT Goals Patient Stated Goal: get better OT Goal Formulation: With patient Time For Goal Achievement: 07/06/24 Potential to Achieve Goals: Good   OT Frequency:  Min 2X/week    Co-evaluation PT/OT/SLP Co-Evaluation/Treatment: Yes Reason for Co-Treatment: Necessary to address cognition/behavior during functional activity;Other (comment) (imminent dc) PT goals addressed during session: Mobility/safety with mobility;Strengthening/ROM OT goals addressed during session: ADL's and self-care;Strengthening/ROM      AM-PAC OT 6 Clicks Daily Activity     Outcome Measure Help from another person eating meals?: None Help from another person taking care of personal grooming?: A Little Help from another person toileting, which includes using toliet, bedpan, or urinal?: A Little Help from another person bathing (including washing, rinsing, drying)?: A Little Help from another person to put on and taking off regular upper body clothing?: A Little Help from another person to put on and taking off regular lower body clothing?: A Little 6 Click Score: 19   End of Session Equipment Utilized During Treatment: Gait belt Nurse Communication: Mobility status (no chair alarm on on arrival)  Activity Tolerance: Patient tolerated treatment well Patient left: in chair;with call bell/phone within reach  OT Visit Diagnosis: Unsteadiness on feet (R26.81);Muscle weakness (generalized) (M62.81);Other symptoms and signs involving cognitive function                Time: 9044-8986 OT Time Calculation (min): 18 min Charges:  OT General Charges $OT Visit: 1 Visit OT Evaluation $OT Eval Low Complexity: 1 Low  Secundino Ellithorpe  JONETTA Lebron MEMO, OTR/L [facility] Office: [phone]   [place] JONETTA Lebron 06/22/2024, 11:26 AM

## 2024-06-22 NOTE — Progress Notes (Signed)
" °   06/22/24 2139  BiPAP/CPAP/SIPAP  BiPAP/CPAP/SIPAP Pt Type Adult  Reason BIPAP/CPAP not in use Other(comment) (Order is PRN,  Patient states she does not want at this time.)  BiPAP/CPAP /SiPAP Vitals  Resp (!) 22  SpO2 94 %  Bilateral Breath Sounds Diminished;Clear  MEWS Score/Color  MEWS Score 1  MEWS Score Color Green    "

## 2024-06-22 NOTE — Progress Notes (Signed)
 " PROGRESS NOTE    Destiny Decker  FMW:[id number] DOB: May 12, 1946 DOA: 06/20/2024 PCP: Merilee, L.Addie, MD (Inactive)   Brief Narrative:  Destiny Decker is a 78 y.o. female with medical history significant of essential hypertension, non-insulin -dependent DM type II, hyperlipidemia and hypothyroidism present emergency department complaining of productive cough for several days with associated generalized weakness.  At baseline  patient lives independently and has normal mental status. This morning she was extremely fatigued and weak. She did have a fall and hit her left posterior ribs on a door. She reports she has a little bit of pain in that area but not severe. She denies any pain with deep inspiration breathing or coughing. There has been no vomiting or diarrhea. Denies any fever and chills.   Admitted for Sepsis 2/2 LLL CAP and Metabolic Encephalopathy/Confusion in setting of Pneumonia and is improving slowly. Appeared a little volume overloaded and had some Pulmonary Vascular congestion so will give her a dose of IV Furosemide . Anticipate D/C in the next 24 hours.  Assessment and Plan:  Severe Sepsis secondary to LLL Community Acquired Pneumonia -Presented to emergency department complaining of fever, chill, productive cough and confusion per patient husband for last few days.  At presentation to ED patient found febrile, borderline hypotensive and tachypneic and Lactic >2.0.  New recomment of oxygen 2 L O2 sat 90 to 96%.  However oxygen saturation never dropped below 88%. -In the setting of leukocytosis, elevated lactic acid, febrile and chest x-ray showed evidence for pneumonia patient meets sepsis criteria. -In the ED patient received IV Ceftriaxone  and Azithromycin  and 2.5 L of LR bolus and code sepsis was activated -IVF w/ LR 150 mL/hr x 20 Hours now stopped. Given a dose of IV Furosemide  given a little volume overloaded and also due to CXR findings today  -Continue IV Ceftriaxone  and IV  Doxycycline . -Respiratory Negative for COVID RSV flu.  Blood cultures are in process.  Checking sputum culture, urine Legionella, urine strep antigen and procalcitonin level. -Need to follow-up with culture result for antibiotic guidance. -C/w Guaifenesin  1200 mg po BID, Flutter Valve, and Incentive Spirometry  -Add DuoNeb q6h -WBC went from 13.3 -> 11.1 -> 6.2; PCT was 3.73 and LA went from 2.3 -> 1.1 -CXR on admission showed LLL Streaky opacity and was unchanged yesterday but CXR today showed Pulmonary vascular congestion without overt pulmonary airspace edema. -Continue supportive care and wean down oxygen to room air as patient tolerates. PT/OT Evaluation recommending HH/OT; -Will need Ambulatory Home O2 Screen and repeat CXR in the AM  Metabolic Encephalopathy/Confusion in setting of Pneumonia: Improving and about baseline. Delirium Precautions   Elevated Troponin 2/2 to demand ischemia in the context of pneumonia: Elevated troponin 46.  Checking second troponin level.  Concern for elevated troponin in the setting of demand ischemia in the context of sepsis and pneumonia.  EKG showing ectopic atrial rhythm heart rate 96.  There is no history of abnormality.  At this time there is no concern for ACS.  Obtained Echocardiogram to assess for any wall motion abnormality.  Continue cardiac monitoring.  Hyponatremia: Mild and Improving. Na+ went from 133 -> 134 -> 139. CTM and Replete a Necessary. Repeat CMP in the AM   Elevated BNP in the setting of Acute Diastolic Grade 2 CHF: Elevated BNP of 1,893.0 and likely worsened with all the IVF she got for Sepsis as above.  No previous echocardiogram on the chart to assess baseline heart function.  Concern for elevated BNP in  the setting of acute reactive process. Obtained ECHO and showed EF of 60-65% w/ No RWMA, mild concentric LVH, G2DD, and a RVSF that was low normal. Her LA, and RA were moderately dilated and noted to have severe Severe Tricuspid  Stenosis. Strict I's and O's and Daily Weights. Will give a dose of IV Furosemide  40 mg x1 as she is +931 mL since admission. Will need Repeat CXR in the AM and Ambulatory Home O2 Screen prior to D/C  Tricuspid Stenosis: Severe on ECHO. Will need outpt Cardiology follow up and monitoring.    Left-Sided Lower Chest wall pain secondary to fall: Patient reported left-sided lower chest wall pain with coughing since the fall.  Chest x-ray no evidence of fracture or dislocation. Continue conservative management with Lidocaine  1 patch 5% TD daily, and Acetaminophen  650 mg po/RC q6h as needed for Mild Pain.  Continue IV Ketorolac  7.5-15 mg q6h as needed for severe pain control.  Essential HTN: Had Sepsis on Admission so held home Antihypertensives w/ Amlodipine  10 mg po Daily, hydrochlorothiazide 12.5 mg po Daily, Isosorbide 30 mg po Dailyprn, and Losartan  50 mg po Daily Held. CTM BP per Protocol. Last BP reading was 145/73   HLD: C/w Rosuvastatin  20 mg po Daily and Omega-3 Acid Etyhl Esters 1 gram po BID  Hypophosphatemia: Phos Level was 1.9. Replete w/ po K Phos  Neutral 500 mg x2. CTM and Repelte as Necessary. Repeat Phos Level in the AM   Non-Insulin  Dependent DM Type II: At home patient is on Jardiance 10 mg po Daily and Metformin 1000 mg po BID which are currently held. C/w Very Sensitive Novolog  SSI AC/HS. CTM CBGs per Protcol. CBG Trend ranging from 104-188 the last 7 checks   Hypothyroidism: Check TSH in the AM. Continue Levothyroxine  75 mcg po Daily   Thrombocytopenia: Plt Count went from 191 -> 145 -> 137. No longer taking her Clopidogrel. CTM for S/Sx of Bleeding; No overt bleeding noted. Repeat CBC in the AM  Hypoalbuminemia: Patient's Albumin Lvl went from 4.5 -> 3.4 -> 3.3. CTM & Trend & repeat CMP in the AM   DVT prophylaxis: enoxaparin  (LOVENOX ) injection 40 mg Start: 06/21/24 1000 SCDs Start: 06/20/24 2146 Place TED hose Start: 06/20/24 2146    Code Status: Full Code Family  Communication: D/w Husband over the Telephone  Disposition Plan:  Level of care: Med-Surg Status is: Inpatient Remains inpatient appropriate because: Needs further clinical improvement and clearance by    Consultants:  None  Procedures:  As delineated as above  Antimicrobials:  Anti-infectives (From admission, onward)    Start     Dose/Rate Route Frequency Ordered Stop   06/21/24 1000  cefTRIAXone  (ROCEPHIN ) 2 g in sodium chloride  0.9 % 100 mL IVPB        2 g 200 mL/hr over 30 Minutes Intravenous Every 24 hours 06/20/24 2140 06/27/24 0959   06/20/24 2230  doxycycline  (VIBRAMYCIN ) 100 mg in sodium chloride  0.9 % 250 mL IVPB        100 mg 125 mL/hr over 120 Minutes Intravenous Every 12 hours 06/20/24 2140 06/27/24 2229   06/20/24 1915  cefTRIAXone  (ROCEPHIN ) 1 g in sodium chloride  0.9 % 100 mL IVPB        1 g 200 mL/hr over 30 Minutes Intravenous  Once 06/20/24 1903 06/20/24 2054   06/20/24 1915  azithromycin  (ZITHROMAX ) 500 mg in sodium chloride  0.9 % 250 mL IVPB        500 mg 250 mL/hr over 60 Minutes  Intravenous  Once 06/20/24 1903 06/20/24 2142       Subjective: Seen and examined at bedside and was doing well and was doing well from oxygen.  No nausea or vomiting.  Thinks her breathing is doing a little bit better.  Denies any lightheadedness or dizziness.  No other concerns or complaints at this time  Objective: Vitals:   06/21/24 2016 06/21/24 2318 06/22/24 1041 06/22/24 1154  BP: 137/70   (!) 145/73  Pulse: (!) 56   (!) 50  Resp: 16   18  Temp: 98.2 F (36.8 C)   98 F (36.7 C)  TempSrc: Oral     SpO2: 96% 95% 95% 95%  Weight:      Height:        Intake/Output Summary (Last 24 hours) at 06/22/2024 1401 Last data filed at 06/22/2024 1340 Gross per 24 hour  Intake 606.85 ml  Output --  Net 606.85 ml   Filed Weights   06/20/24 1659  Weight: 68 kg   Examination: Physical Exam:  Constitutional: WN/WD elderly Caucasian female no acute  distress Respiratory: Diminished to auscultation bilaterally with some coarse breath sounds and has some rhonchi and crackles noted but no appreciable wheezing or rales. Normal respiratory effort and patient is not tachypenic. No accessory muscle use.  Has been weaned off of supplemental oxygen via nasal cannula Cardiovascular: RRR, no murmurs / rubs / gallops. S1 and S2 auscultated.  Mild extremity Abdomen: Soft, non-tender, non-distended. Bowel sounds positive.  GU: Deferred. Musculoskeletal: No clubbing / cyanosis of digits/nails. No joint deformity upper and lower extremities.  Skin: No rashes, lesions, ulcers on limited skin evaluation. No induration; Warm and dry.  Neurologic: CN 2-12 grossly intact with no focal deficits. Romberg sign and cerebellar reflexes not assessed.  Psychiatric: Normal judgment and insight. Alert and oriented x 3. Normal mood and appropriate affect.   Data Reviewed: I have personally reviewed following labs and imaging studies  CBC: Recent Labs  Lab 06/20/24 1703 06/21/24 0846 06/22/24 0603  WBC 13.3* 11.1* 6.2  NEUTROABS  --  9.2* 4.6  HGB 15.8* 12.6 12.1  HCT 47.2* 38.1 36.2  MCV 85.4 86.6 86.4  PLT 191 145* 137*   Basic Metabolic Panel: Recent Labs  Lab 06/20/24 1703 06/21/24 0846 06/22/24 0603  NA 133* 134* 139  K 3.8 3.7 3.7  CL 91* 98 103  CO2 25 26 27   GLUCOSE 187* 241* 120*  BUN 18 23 18   CREATININE 0.97 0.85 0.55  CALCIUM  9.2 8.9 8.6*  MG  --  1.9 2.0  PHOS  --  2.6 1.9*   GFR: Estimated Creatinine Clearance: 53.4 mL/min (by C-G formula based on SCr of 0.55 mg/dL). Liver Function Tests: Recent Labs  Lab 06/20/24 1703 06/21/24 0846 06/22/24 0603  AST 37 41 38  ALT 20 20 20   ALKPHOS 57 58 50  BILITOT 0.6 0.5 0.3  PROT 7.7 6.1* 6.0*  ALBUMIN 4.5 3.4* 3.3*   No results for input(s): LIPASE, AMYLASE in the last 168 hours. No results for input(s): AMMONIA in the last 168 hours. Coagulation Profile: No results for  input(s): INR, PROTIME in the last 168 hours. Cardiac Enzymes: No results for input(s): CKTOTAL, CKMB, CKMBINDEX, TROPONINI in the last 168 hours. BNP (last 3 results) Recent Labs    06/20/24 1925  PROBNP 1,893.0*   HbA1C: No results for input(s): HGBA1C in the last 72 hours. CBG: Recent Labs  Lab 06/21/24 1224 06/21/24 1737 06/21/24 2136 06/22/24 0739 06/22/24  1152  GLUCAP 104* 114* 125* 125* 144*   Lipid Profile: No results for input(s): CHOL, HDL, LDLCALC, TRIG, CHOLHDL, LDLDIRECT in the last 72 hours. Thyroid  Function Tests: No results for input(s): TSH, T4TOTAL, FREET4, T3FREE, THYROIDAB in the last 72 hours. Anemia Panel: No results for input(s): VITAMINB12, FOLATE, FERRITIN, TIBC, IRON, RETICCTPCT in the last 72 hours. Sepsis Labs: Recent Labs  Lab 06/20/24 1925 06/20/24 2221 06/21/24 0117  PROCALCITON  --  3.73  --   LATICACIDVEN 2.3* 2.3* 1.1   Recent Results (from the past 240 hours)  Resp panel by RT-PCR (RSV, Flu A&B, Covid) Anterior Nasal Swab     Status: None   Collection Time: 06/20/24  5:02 PM   Specimen: Anterior Nasal Swab  Result Value Ref Range Status   SARS Coronavirus 2 by RT PCR NEGATIVE NEGATIVE Final    Comment: (NOTE) SARS-CoV-2 target nucleic acids are NOT DETECTED.  The SARS-CoV-2 RNA is generally detectable in upper respiratory specimens during the acute phase of infection. The lowest concentration of SARS-CoV-2 viral copies this assay can detect is 138 copies/mL. A negative result does not preclude SARS-Cov-2 infection and should not be used as the sole basis for treatment or other patient management decisions. A negative result may occur with  improper specimen collection/handling, submission of specimen other than nasopharyngeal swab, presence of viral mutation(s) within the areas targeted by this assay, and inadequate number of viral copies(<138 copies/mL). A negative result must be  combined with clinical observations, patient history, and epidemiological information. The expected result is Negative.  Fact Sheet for Patients:  bloggercourse.com  Fact Sheet for Healthcare Providers:  seriousbroker.it  This test is no t yet approved or cleared by the [place]  FDA and  has been authorized for detection and/or diagnosis of SARS-CoV-2 by FDA under an Emergency Use Authorization (EUA). This EUA will remain  in effect (meaning this test can be used) for the duration of the COVID-19 declaration under Section 564(b)(1) of the Act, 21 U.S.C.section 360bbb-3(b)(1), unless the authorization is terminated  or revoked sooner.       Influenza A by PCR NEGATIVE NEGATIVE Final   Influenza B by PCR NEGATIVE NEGATIVE Final    Comment: (NOTE) The Xpert Xpress SARS-CoV-2/FLU/RSV plus assay is intended as an aid in the diagnosis of influenza from Nasopharyngeal swab specimens and should not be used as a sole basis for treatment. Nasal washings and aspirates are unacceptable for Xpert Xpress SARS-CoV-2/FLU/RSV testing.  Fact Sheet for Patients: bloggercourse.com  Fact Sheet for Healthcare Providers: seriousbroker.it  This test is not yet approved or cleared by the [place]  FDA and has been authorized for detection and/or diagnosis of SARS-CoV-2 by FDA under an Emergency Use Authorization (EUA). This EUA will remain in effect (meaning this test can be used) for the duration of the COVID-19 declaration under Section 564(b)(1) of the Act, 21 U.S.C. section 360bbb-3(b)(1), unless the authorization is terminated or revoked.     Resp Syncytial Virus by PCR NEGATIVE NEGATIVE Final    Comment: (NOTE) Fact Sheet for Patients: bloggercourse.com  Fact Sheet for Healthcare Providers: seriousbroker.it  This test is not yet  approved or cleared by the [place]  FDA and has been authorized for detection and/or diagnosis of SARS-CoV-2 by FDA under an Emergency Use Authorization (EUA). This EUA will remain in effect (meaning this test can be used) for the duration of the COVID-19 declaration under Section 564(b)(1) of the Act, 21 U.S.C. section 360bbb-3(b)(1), unless the authorization is terminated  or revoked.  Performed at [facility], [address] [place], [place] [address]   Culture, blood (routine x 2)     Status: None (Preliminary result)   Collection Time: 06/20/24  7:03 PM   Specimen: BLOOD  Result Value Ref Range Status   Specimen Description BLOOD LEFT ANTECUBITAL  Final   Special Requests   Final    BOTTLES DRAWN AEROBIC AND ANAEROBIC Blood Culture adequate volume   Culture   Final    NO GROWTH 2 DAYS Performed at [facility], [address] [place], [place] [address]    Report Status PENDING  Incomplete  Culture, blood (routine x 2)     Status: None (Preliminary result)   Collection Time: 06/20/24  8:00 PM   Specimen: BLOOD  Result Value Ref Range Status   Specimen Description BLOOD SITE NOT SPECIFIED  Final   Special Requests   Final    BOTTLES DRAWN AEROBIC AND ANAEROBIC Blood Culture adequate volume   Culture   Final    NO GROWTH 2 DAYS Performed at [facility], [address], [place], [place] [address]    Report Status PENDING  Incomplete    Radiology Studies: DG CHEST PORT 1 VIEW Result Date: 06/22/2024 CLINICAL DATA:  Shortness of breath. EXAM: PORTABLE CHEST 1 VIEW COMPARISON:  06/21/2024 FINDINGS: Cardiopericardial silhouette is at upper limits of normal for size. There is pulmonary vascular congestion without overt pulmonary airspace edema. No focal airspace consolidation or pleural effusion. No acute bony abnormality. Telemetry leads overlie the chest. IMPRESSION: Pulmonary vascular congestion without overt pulmonary airspace edema.  Electronically Signed   By: Camellia Candle M.D.   On: 06/22/2024 07:26   ECHOCARDIOGRAM COMPLETE Result Date: 06/21/2024    ECHOCARDIOGRAM REPORT   Patient Name:   FLORENA KOZMA Date of Exam: 06/21/2024 Medical Rec #:  [id number]  Height:       66.0 in Accession #:    [id number] Weight:       150.0 lb Date of Birth:  May 26, 1946  BSA:          1.770 m Patient Age:    78 years   BP:           125/63 mmHg Patient Gender: F          HR:           59 bpm. Exam Location:  Inpatient Procedure: 2D Echo, Cardiac Doppler and Color Doppler (Both Spectral and Color            Flow Doppler were utilized during procedure). Indications:    R06.02 SOB. ; I26.02 Pulmonary embolus. Elevated BNP  History:        Patient has no prior history of Echocardiogram examinations.                 Signs/Symptoms:Fever; Risk Factors:Hypertension and Diabetes.                 Pneumonia.  Sonographer:    Ellouise Mose RDCS Referring Phys: [id number] SUBRINA SUNDIL IMPRESSIONS  1. Left ventricular ejection fraction, by estimation, is 60 to 65%. Left ventricular ejection fraction by PLAX is 65 %. The left ventricle has normal function. The left ventricle has no regional wall motion abnormalities. There is mild concentric left ventricular hypertrophy. Left ventricular diastolic parameters are consistent with Grade II diastolic dysfunction (pseudonormalization).  2. Right ventricular systolic function is low normal. The right ventricular size is mildly enlarged. There  is normal pulmonary artery systolic pressure. The estimated right ventricular systolic pressure is 35.1 mmHg.  3. Left atrial size was moderately dilated.  4. Right atrial size was moderately dilated.  5. The mitral valve is normal in structure. Trivial mitral valve regurgitation. No evidence of mitral stenosis.  6. Severe tricuspid stenosis.  7. The aortic valve is tricuspid. There is mild calcification of the aortic valve. Aortic valve regurgitation is trivial. Aortic valve sclerosis is  present, with no evidence of aortic valve stenosis.  8. The inferior vena cava is dilated in size with <50% respiratory variability, suggesting right atrial pressure of 15 mmHg. Comparison(s): No prior Echocardiogram. FINDINGS  Left Ventricle: Left ventricular ejection fraction, by estimation, is 60 to 65%. Left ventricular ejection fraction by PLAX is 65 %. The left ventricle has normal function. The left ventricle has no regional wall motion abnormalities. The left ventricular internal cavity size was normal in size. There is mild concentric left ventricular hypertrophy. Left ventricular diastolic parameters are consistent with Grade II diastolic dysfunction (pseudonormalization). Right Ventricle: The right ventricular size is mildly enlarged. No increase in right ventricular wall thickness. Right ventricular systolic function is low normal. There is normal pulmonary artery systolic pressure. The tricuspid regurgitant velocity is 2.24 m/s, and with an assumed right atrial pressure of 15 mmHg, the estimated right ventricular systolic pressure is 35.1 mmHg. Left Atrium: Left atrial size was moderately dilated. Right Atrium: Right atrial size was moderately dilated. Pericardium: There is no evidence of pericardial effusion. Mitral Valve: The mitral valve is normal in structure. Mild mitral annular calcification. Trivial mitral valve regurgitation. No evidence of mitral valve stenosis. Tricuspid Valve: The tricuspid valve is normal in structure. Tricuspid valve regurgitation is mild . Severe tricuspid stenosis. Aortic Valve: The aortic valve is tricuspid. There is mild calcification of the aortic valve. Aortic valve regurgitation is trivial. Aortic valve sclerosis is present, with no evidence of aortic valve stenosis. Pulmonic Valve: The pulmonic valve was normal in structure. Pulmonic valve regurgitation is trivial. No evidence of pulmonic stenosis. Aorta: The aortic root and ascending aorta are structurally normal,  with no evidence of dilitation. Venous: The inferior vena cava is dilated in size with less than 50% respiratory variability, suggesting right atrial pressure of 15 mmHg. IAS/Shunts: No atrial level shunt detected by color flow Doppler.  LEFT VENTRICLE PLAX 2D LV EF:         Left            Diastology                ventricular     LV e' medial:    5.44 cm/s                ejection        LV E/e' medial:  16.4                fraction by     LV e' lateral:   6.20 cm/s                PLAX is 65      LV E/e' lateral: 14.4                %. LVIDd:         4.35 cm LVIDs:         2.80 cm LV PW:         0.95 cm LV IVS:        1.20 cm LVOT  diam:     2.40 cm LV SV:         123 LV SV Index:   70 LVOT Area:     4.52 cm  LV Volumes (MOD) LV vol d, MOD    85.2 ml A2C: LV vol d, MOD    48.7 ml A4C: LV vol s, MOD    22.0 ml A2C: LV vol s, MOD    18.2 ml A4C: LV SV MOD A2C:   63.2 ml LV SV MOD A4C:   48.7 ml LV SV MOD BP:    43.8 ml RIGHT VENTRICLE         IVC TAPSE (M-mode): 2.3 cm  IVC diam: 2.30 cm LEFT ATRIUM             Index        RIGHT ATRIUM           Index LA diam:        3.60 cm 2.03 cm/m   RA Area:     17.10 cm LA Vol (A2C):   40.7 ml 23.00 ml/m  RA Volume:   47.60 ml  26.90 ml/m LA Vol (A4C):   40.0 ml 22.60 ml/m LA Biplane Vol: 40.6 ml 22.94 ml/m  AORTIC VALVE LVOT Vmax:   133.00 cm/s LVOT Vmean:  89.600 cm/s LVOT VTI:    0.272 m  AORTA Ao Root diam: 3.30 cm Ao Asc diam:  3.40 cm MITRAL VALVE               TRICUSPID VALVE MV Area (PHT): 3.08 cm    TR Peak grad:   20.1 mmHg MV Decel Time: 246 msec    TR Vmax:        224.00 cm/s MV E velocity: 89.20 cm/s MV A velocity: 80.40 cm/s  SHUNTS MV E/A ratio:  1.11        Systemic VTI:  0.27 m                            Systemic Diam: 2.40 cm Toribio Fuel MD Electronically signed by Toribio Fuel MD Signature Date/Time: 06/21/2024/7:45:35 PM    Final    DG CHEST PORT 1 VIEW Result Date: 06/21/2024 EXAM: 1 VIEW(S) XRAY OF THE CHEST 06/21/2024 10:00:56 AM  COMPARISON: 06/20/2024 CLINICAL HISTORY: SOB (shortness of breath) FINDINGS: LUNGS AND PLEURA: Streaky left lung base opacity, overall similar to prior. No pleural effusion. No pneumothorax. HEART AND MEDIASTINUM: Aortic atherosclerosis. No acute abnormality of the cardiac and mediastinal silhouettes. BONES AND SOFT TISSUES: No acute osseous abnormality. IMPRESSION: 1. Unchanged left basilar opacity. Electronically signed by: Michaeline Blanch MD 06/21/2024 01:24 PM EST RP Workstation: [id number]   DG Chest 2 View Result Date: 06/20/2024 CLINICAL DATA:  Productive cough. EXAM: CHEST - 2 VIEW COMPARISON:  04/26/2006 FINDINGS: Patchy left lower lobe opacity suspicious for pneumonia. The heart is normal in size. Mediastinal contours are normal. No pulmonary edema, pleural effusion, or pneumothorax. No acute osseous findings. IMPRESSION: Patchy left lower lobe opacity suspicious for pneumonia. Electronically Signed   By: Andrea Gasman M.D.   On: 06/20/2024 18:56   Scheduled Meds:  aspirin   81 mg Oral Daily   cholecalciferol   1,000 Units Oral q morning   docusate sodium   100 mg Oral BID   enoxaparin  (LOVENOX ) injection  40 mg Subcutaneous Q24H   guaiFENesin   1,200 mg Oral BID   insulin  aspart  0-5 Units Subcutaneous QHS   insulin   aspart  0-6 Units Subcutaneous TID WC   ipratropium-albuterol   3 mL Nebulization Q6H   levothyroxine   75 mcg Oral Q0600   lidocaine   1 patch Transdermal Q24H   omega-3 acid ethyl esters  1 g Oral BID   phosphorus  500 mg Oral BID   rosuvastatin   20 mg Oral Daily   sodium chloride  flush  3 mL Intravenous Q12H   Continuous Infusions:  cefTRIAXone  (ROCEPHIN )  IV Stopped (06/22/24 1111)   doxycycline  (VIBRAMYCIN ) IV 126 mL/hr at 06/22/24 1340    LOS: 1 day   Alejandro Marker, DO [facility] Available via Epic secure chat 7am-7pm After these hours, please refer to coverage provider listed on [email or web address] 06/22/2024, 2:01 PM  "

## 2024-06-22 NOTE — Progress Notes (Signed)
 Asked PT about cpap PT states she does not use one at home, and does not want to.

## 2024-06-22 NOTE — Plan of Care (Signed)
" °  Problem: Education: Goal: Knowledge of General Education information will improve Description: Including pain rating scale, medication(s)/side effects and non-pharmacologic comfort measures Outcome: Progressing   Problem: Clinical Measurements: Goal: Respiratory complications will improve Outcome: Progressing Goal: Cardiovascular complication will be avoided Outcome: Progressing   Problem: Activity: Goal: Risk for activity intolerance will decrease Outcome: Progressing   Problem: Coping: Goal: Level of anxiety will decrease Outcome: Progressing   Problem: Elimination: Goal: Will not experience complications related to bowel motility Outcome: Progressing Goal: Will not experience complications related to urinary retention Outcome: Progressing   Problem: Pain Managment: Goal: General experience of comfort will improve and/or be controlled Outcome: Progressing   Problem: Safety: Goal: Ability to remain free from injury will improve Outcome: Progressing   Problem: Activity: Goal: Ability to tolerate increased activity will improve Outcome: Progressing   Problem: Clinical Measurements: Goal: Ability to maintain a body temperature in the normal range will improve Outcome: Progressing   Problem: Respiratory: Goal: Ability to maintain adequate ventilation will improve Outcome: Progressing Goal: Ability to maintain a clear airway will improve Outcome: Progressing   Problem: Education: Goal: Ability to describe self-care measures that may prevent or decrease complications (Diabetes Survival Skills Education) will improve Outcome: Progressing   Problem: Coping: Goal: Ability to adjust to condition or change in health will improve Outcome: Progressing   Problem: Fluid Volume: Goal: Ability to maintain a balanced intake and output will improve Outcome: Progressing   Problem: Health Behavior/Discharge Planning: Goal: Ability to manage health-related needs will  improve Outcome: Progressing   Problem: Metabolic: Goal: Ability to maintain appropriate glucose levels will improve Outcome: Progressing   Problem: Nutritional: Goal: Maintenance of adequate nutrition will improve Outcome: Progressing   Problem: Skin Integrity: Goal: Risk for impaired skin integrity will decrease Outcome: Progressing   Problem: Tissue Perfusion: Goal: Adequacy of tissue perfusion will improve Outcome: Progressing   "

## 2024-06-22 NOTE — Evaluation (Signed)
 Physical Therapy Evaluation Patient Details Name: Destiny Decker MRN: [id number] DOB: [birth year] Today's Date: 06/22/2024  History of Present Illness  Patient is a 78y.o. female admitted on 12/23 dur to productive dough for several days and generalized weakness. PMH: HTN, non-insulin  dependent Dm, hyperlipidemia, and hypothyroidism. Chest xray showed pulmonary vascular congestion without overt pulmonary airspace edema.  Clinical Impression  PTA, pt reports being IND with mobility, ADLs, assists with IADLs, and drives. She lives with her spouse in a two story home with no steps to enter, laundry is in the basement otherwise she can live on the main level. She currently requires MIN A for transfers and ambulation up to 71ft with her biggest limitation being L flank/back pain from fall just prior to admission. See below for additional deficits impacting her mobility and independence. PT will continue to follow her while she is admitted to acute hospital, and recommend follow up with HHPT at discharge.         If plan is discharge home, recommend the following: A little help with walking and/or transfers;A little help with bathing/dressing/bathroom;Assist for transportation   Can travel by private vehicle        Equipment Recommendations None recommended by PT  Recommendations for Other Services       Functional Status Assessment Patient has had a recent decline in their functional status and demonstrates the ability to make significant improvements in function in a reasonable and predictable amount of time.     Precautions / Restrictions Precautions Precautions: None Recall of Precautions/Restrictions: Intact Restrictions Weight Bearing Restrictions Per Provider Order: No      Mobility  Bed Mobility               General bed mobility comments: seated in recliner upon PT arrival    Transfers Overall transfer level: Needs assistance Equipment used: 1 person hand held  assist Transfers: Sit to/from Stand Sit to Stand: Contact guard assist           General transfer comment: increased pain when powering up to stand    Ambulation/Gait Ambulation/Gait assistance: Min assist Gait Distance (Feet): 75 Feet Assistive device: 2 person hand held assist Gait Pattern/deviations: Step-through pattern, Decreased step length - right, Decreased step length - left, Antalgic, Narrow base of support Gait velocity: decreased     General Gait Details: increased pain with ambulation, initially reported dizziness with standing however resolved with time. Standing BP 138/65, HR 62  Stairs            Wheelchair Mobility     Tilt Bed    Modified Rankin (Stroke Patients Only)       Balance Overall balance assessment: Needs assistance Sitting-balance support: Feet supported Sitting balance-Leahy Scale: Good     Standing balance support: Bilateral upper extremity supported, During functional activity Standing balance-Leahy Scale: Poor Standing balance comment: balance is impact by elevated pain levels                             Pertinent Vitals/Pain Pain Assessment Pain Assessment: Faces Faces Pain Scale: Hurts even more Pain Location: left flank, back from fall Pain Descriptors / Indicators: Discomfort, Aching, Grimacing Pain Intervention(s): Repositioned, Monitored during session, Limited activity within patient's tolerance, Patient requesting pain meds-RN notified    Home Living Family/patient expects to be discharged to:: Private residence Living Arrangements: Spouse/significant other Available Help at Discharge: Family Type of Home: House Home Access: Level entry  Alternate Level Stairs-Number of Steps: flight Home Layout: Two level (washer/dryer in basement) Home Equipment: Rollator (4 wheels)      Prior Function Prior Level of Function : Independent/Modified Independent;Driving             Mobility Comments:  mobility without device, drives ADLs Comments: IND ADLs, IADLs     Extremity/Trunk Assessment   Upper Extremity Assessment Upper Extremity Assessment: Defer to OT evaluation    Lower Extremity Assessment Lower Extremity Assessment: Generalized weakness    Cervical / Trunk Assessment Cervical / Trunk Assessment: Normal  Communication   Communication Communication: No apparent difficulties    Cognition Arousal: Alert Behavior During Therapy: WFL for tasks assessed/performed   PT - Cognitive impairments: History of cognitive impairments, Orientation   Orientation impairments: Time                   PT - Cognition Comments: initial cog deficits noted with A&O questions, however pt able to self correct (month, year), possible cog deficits from prior stroke and postconsussive syndrome Following commands: Intact       Cueing Cueing Techniques: Verbal cues, Tactile cues     General Comments      Exercises     Assessment/Plan    PT Assessment Patient needs continued PT services  PT Problem List Decreased strength;Decreased activity tolerance;Pain;Decreased mobility;Decreased balance       PT Treatment Interventions Gait training;Functional mobility training;Therapeutic exercise;Balance training    PT Goals (Current goals can be found in the Care Plan section)  Acute Rehab PT Goals Patient Stated Goal: control my pain PT Goal Formulation: With patient Time For Goal Achievement: 07/06/24 Potential to Achieve Goals: Good    Frequency Min 2X/week     Co-evaluation PT/OT/SLP Co-Evaluation/Treatment: Yes Reason for Co-Treatment: Necessary to address cognition/behavior during functional activity;Other (comment) (immenent d/c) PT goals addressed during session: Mobility/safety with mobility;Strengthening/ROM OT goals addressed during session: ADL's and self-care;Strengthening/ROM       AM-PAC PT 6 Clicks Mobility  Outcome Measure Help needed turning  from your back to your side while in a flat bed without using bedrails?: A Little Help needed moving from lying on your back to sitting on the side of a flat bed without using bedrails?: A Little Help needed moving to and from a bed to a chair (including a wheelchair)?: A Little Help needed standing up from a chair using your arms (e.g., wheelchair or bedside chair)?: A Little Help needed to walk in hospital room?: A Little Help needed climbing 3-5 steps with a railing? : A Lot 6 Click Score: 17    End of Session Equipment Utilized During Treatment: Gait belt Activity Tolerance: Patient tolerated treatment well;Patient limited by pain Patient left: in chair;with call bell/phone within reach Nurse Communication: Mobility status;Patient requests pain meds PT Visit Diagnosis: Unsteadiness on feet (R26.81);Pain;Muscle weakness (generalized) (M62.81) Pain - Right/Left: Left Pain - part of body:  (L flank and back)    Time: 9043-8984 PT Time Calculation (min) (ACUTE ONLY): 19 min   Charges:   PT Evaluation $PT Eval Low Complexity: 1 Low             Alsha Meland H. Jaydis Duchene, PT, DPT   [employer] 06/22/2024, 10:59 AM

## 2024-06-23 ENCOUNTER — Other Ambulatory Visit (HOSPITAL_COMMUNITY): Payer: Self-pay

## 2024-06-23 ENCOUNTER — Inpatient Hospital Stay (HOSPITAL_COMMUNITY)

## 2024-06-23 DIAGNOSIS — I5033 Acute on chronic diastolic (congestive) heart failure: Secondary | ICD-10-CM

## 2024-06-23 LAB — CBC WITH DIFFERENTIAL/PLATELET
Abs Immature Granulocytes: 0.05 K/uL (ref 0.00–0.07)
Basophils Absolute: 0 K/uL (ref 0.0–0.1)
Basophils Relative: 0 %
Eosinophils Absolute: 0.1 K/uL (ref 0.0–0.5)
Eosinophils Relative: 1 %
HCT: 36.7 % (ref 36.0–46.0)
Hemoglobin: 12.2 g/dL (ref 12.0–15.0)
Immature Granulocytes: 1 %
Lymphocytes Relative: 21 %
Lymphs Abs: 1.1 K/uL (ref 0.7–4.0)
MCH: 28.6 pg (ref 26.0–34.0)
MCHC: 33.2 g/dL (ref 30.0–36.0)
MCV: 86.2 fL (ref 80.0–100.0)
Monocytes Absolute: 0.4 K/uL (ref 0.1–1.0)
Monocytes Relative: 7 %
Neutro Abs: 3.7 K/uL (ref 1.7–7.7)
Neutrophils Relative %: 70 %
Platelets: 152 K/uL (ref 150–400)
RBC: 4.26 MIL/uL (ref 3.87–5.11)
RDW: 13.7 % (ref 11.5–15.5)
WBC: 5.2 K/uL (ref 4.0–10.5)
nRBC: 0 % (ref 0.0–0.2)

## 2024-06-23 LAB — COMPREHENSIVE METABOLIC PANEL WITH GFR
ALT: 30 U/L (ref 0–44)
AST: 42 U/L — ABNORMAL HIGH (ref 15–41)
Albumin: 3.5 g/dL (ref 3.5–5.0)
Alkaline Phosphatase: 62 U/L (ref 38–126)
Anion gap: 11 (ref 5–15)
BUN: 14 mg/dL (ref 8–23)
CO2: 26 mmol/L (ref 22–32)
Calcium: 9.1 mg/dL (ref 8.9–10.3)
Chloride: 103 mmol/L (ref 98–111)
Creatinine, Ser: 0.52 mg/dL (ref 0.44–1.00)
GFR, Estimated: >60 mL/min
Glucose, Bld: 161 mg/dL — ABNORMAL HIGH (ref 70–99)
Potassium: 3.5 mmol/L (ref 3.5–5.1)
Sodium: 140 mmol/L (ref 135–145)
Total Bilirubin: 0.3 mg/dL (ref 0.0–1.2)
Total Protein: 6.2 g/dL — ABNORMAL LOW (ref 6.5–8.1)

## 2024-06-23 LAB — MAGNESIUM: Magnesium: 2.2 mg/dL (ref 1.7–2.4)

## 2024-06-23 LAB — PHOSPHORUS: Phosphorus: 2.8 mg/dL (ref 2.5–4.6)

## 2024-06-23 LAB — TSH: TSH: 3.73 u[IU]/mL (ref 0.350–4.500)

## 2024-06-23 LAB — GLUCOSE, CAPILLARY
Glucose-Capillary: 158 mg/dL — ABNORMAL HIGH (ref 70–99)
Glucose-Capillary: 279 mg/dL — ABNORMAL HIGH (ref 70–99)

## 2024-06-23 MED ORDER — FUROSEMIDE 10 MG/ML IJ SOLN
40.0000 mg | Freq: Once | INTRAMUSCULAR | Status: AC
  Administered 2024-06-23: 40 mg via INTRAVENOUS
  Filled 2024-06-23: qty 4

## 2024-06-23 MED ORDER — CEFDINIR 300 MG PO CAPS
300.0000 mg | ORAL_CAPSULE | Freq: Two times a day (BID) | ORAL | 0 refills | Status: AC
  Filled 2024-06-23: qty 8, 4d supply, fill #0

## 2024-06-23 MED ORDER — LIDOCAINE 5 % EX PTCH
1.0000 | MEDICATED_PATCH | CUTANEOUS | 0 refills | Status: AC
  Filled 2024-06-23: qty 30, 30d supply, fill #0

## 2024-06-23 MED ORDER — ONDANSETRON HCL 4 MG PO TABS
4.0000 mg | ORAL_TABLET | Freq: Four times a day (QID) | ORAL | 0 refills | Status: AC | PRN
  Filled 2024-06-23: qty 20, 5d supply, fill #0

## 2024-06-23 MED ORDER — DOXYCYCLINE HYCLATE 100 MG PO TABS
100.0000 mg | ORAL_TABLET | Freq: Two times a day (BID) | ORAL | 0 refills | Status: AC
  Filled 2024-06-23: qty 8, 4d supply, fill #0

## 2024-06-23 MED ORDER — GUAIFENESIN ER 600 MG PO TB12
600.0000 mg | ORAL_TABLET | Freq: Two times a day (BID) | ORAL | 0 refills | Status: AC
  Filled 2024-06-23: qty 10, 5d supply, fill #0

## 2024-06-23 MED ORDER — ACETAMINOPHEN 325 MG PO TABS
650.0000 mg | ORAL_TABLET | Freq: Four times a day (QID) | ORAL | 0 refills | Status: AC | PRN
  Filled 2024-06-23: qty 20, 3d supply, fill #0

## 2024-06-23 NOTE — Progress Notes (Signed)
 SATURATION QUALIFICATIONS: (This note is used to comply with regulatory documentation for home oxygen)  Patient Saturations on Room Air at Rest = 95%  Patient Saturations on Room Air while Ambulating = 93%  Patient Saturations on 0 Liters of oxygen while Ambulating = 93%  Please briefly explain why patient needs home oxygen:

## 2024-06-23 NOTE — TOC Transition Note (Signed)
 Transition of Care [facility]) - Discharge Note   Patient Details  Name: Destiny Decker MRN: [id number] Date of Birth: 1945/08/21  [facility] CM/SW Contact:  NORMAN ASPEN, LCSW Phone Number: 06/23/2024, 11:48 AM   Clinical Narrative:    Noted pt cleared for dc home today and orders in for HHPT/OT.  Met with pt and spouse who are agreeable with f/u and no agency preference.  Referral accepted by [facility] and agency info on AVS.  No further IP CM needs.   Final next level of care: Home w Home Health Services Barriers to Discharge: No Barriers Identified   Patient Goals and CMS Choice Patient states their goals for this hospitalization and ongoing recovery are:: return home          Discharge Placement                       Discharge Plan and Services Additional resources added to the After Visit Summary for                  DME Arranged: N/A DME Agency: NA       HH Arranged: PT, OT HH Agency: [facility] Date [facility] Agency Contacted: 06/23/24 Time HH Agency Contacted: [phone] Representative spoke with at [facility] Agency: Cindie  Social Drivers of Health (SDOH) Interventions SDOH Screenings   Food Insecurity: No Food Insecurity (06/20/2024)  Housing: Low Risk (06/20/2024)  Transportation Needs: No Transportation Needs (06/20/2024)  Utilities: Not At Risk (06/20/2024)  Social Connections: Socially Integrated (06/20/2024)  Tobacco Use: Low Risk (06/20/2024)     Readmission Risk Interventions    06/23/2024   11:41 AM  Readmission Risk Prevention Plan  Post Dischage Appt Complete  Medication Screening Complete  Transportation Screening Complete

## 2024-06-23 NOTE — Plan of Care (Signed)
" °  Problem: Education: Goal: Knowledge of General Education information will improve Description: Including pain rating scale, medication(s)/side effects and non-pharmacologic comfort measures Outcome: Progressing   Problem: Health Behavior/Discharge Planning: Goal: Ability to manage health-related needs will improve Outcome: Progressing   Problem: Clinical Measurements: Goal: Will remain free from infection Outcome: Progressing Goal: Diagnostic test results will improve Outcome: Progressing Goal: Cardiovascular complication will be avoided Outcome: Progressing   Problem: Activity: Goal: Risk for activity intolerance will decrease Outcome: Progressing   Problem: Nutrition: Goal: Adequate nutrition will be maintained Outcome: Progressing   Problem: Coping: Goal: Level of anxiety will decrease Outcome: Progressing   Problem: Elimination: Goal: Will not experience complications related to bowel motility Outcome: Progressing Goal: Will not experience complications related to urinary retention Outcome: Progressing   Problem: Safety: Goal: Ability to remain free from injury will improve Outcome: Progressing   Problem: Skin Integrity: Goal: Risk for impaired skin integrity will decrease Outcome: Progressing   "

## 2024-06-25 LAB — CULTURE, BLOOD (ROUTINE X 2)
Culture: NO GROWTH
Culture: NO GROWTH
Special Requests: ADEQUATE
Special Requests: ADEQUATE

## 2024-06-26 NOTE — Discharge Summary (Signed)
 " Physician Discharge Summary   Patient: Destiny Decker MRN: [id number] DOB: July 24, 1945  Admit date:     06/20/2024  Discharge date: 06/23/2024  Discharge Physician: Alejandro Marker, DO   PCP: Merilee, L.Addie, MD (Inactive)   Recommendations at discharge:   Follow-up with PCP within 1 to 2 weeks repeat CBC, CMP, mag, Phos within 1 week Follow-up with cardiology outpatient setting within 1 to 2 weeks Repeat chest x-ray in 3 to 6 weeks  Discharge Diagnoses: Principal Problem:   Sepsis due to pneumonia [facility]) Active Problems:   Acute metabolic encephalopathy   Non-insulin  dependent type 2 diabetes mellitus (HCC)   Essential hypertension   Elevated troponin   Elevated brain natriuretic peptide (BNP) level   Hypothyroidism   CAP (community acquired pneumonia)  Resolved Problems:   * No resolved hospital problems. *  Hospital Course: Destiny Decker is a 78 y.o. female with medical history significant of essential hypertension, non-insulin -dependent DM type II, hyperlipidemia and hypothyroidism present emergency department complaining of productive cough for several days with associated generalized weakness.  At baseline  patient lives independently and has normal mental status. This morning she was extremely fatigued and weak. She did have a fall and hit her left posterior ribs on a door. She reports she has a little bit of pain in that area but not severe. She denies any pain with deep inspiration breathing or coughing. There has been no vomiting or diarrhea. Denies any fever and chills.   Admitted for Sepsis 2/2 LLL CAP and Metabolic Encephalopathy/Confusion in setting of Pneumonia and is improving slowly. Appeared a little volume overloaded and had some Pulmonary Vascular congestion so will give her a dose of IV Furosemide  and she was given another 1 prior to discharge.  She improved and did not desaturate and is medically stable for discharge only to follow with PCP and cardiology outpatient  setting repeat chest x-ray to 6 weeks.  Assessment and Plan:  Severe Sepsis secondary to LLL Community Acquired Pneumonia -Presented to emergency department complaining of fever, chill, productive cough and confusion per patient husband for last few days.  At presentation to ED patient found febrile, borderline hypotensive and tachypneic and Lactic >2.0.  New recomment of oxygen 2 L O2 sat 90 to 96%.  However oxygen saturation never dropped below 88%. -In the setting of leukocytosis, elevated lactic acid, febrile and chest x-ray showed evidence for pneumonia patient meets sepsis criteria. -In the ED patient received IV Ceftriaxone  and Azithromycin  and 2.5 L of LR bolus and code sepsis was activated -IVF w/ LR 150 mL/hr x 20 Hours now stopped. Given a dose of IV Furosemide  given a little volume overloaded and also due to CXR findings today  -Continued IV Ceftriaxone  and IV Doxycycline  and changed to p.o. at the time of discharge -Respiratory Negative for COVID RSV flu.  Blood cultures are in process.  Checking sputum culture, urine Legionella, urine strep antigen and procalcitonin level. -Need to follow-up with culture result for antibiotic guidance. -C/w Guaifenesin  1200 mg po BID, Flutter Valve, and Incentive Spirometry  -Add DuoNeb q6h -WBC went from 13.3 -> 11.1 -> 6.2 and is now 5.2; PCT was 3.73 and LA went from 2.3 -> 1.1 -CXR on admission showed LLL Streaky opacity and was unchanged yesterday but CXR today showed Pulmonary vascular congestion without overt pulmonary airspace edema. -Continue supportive care and wean down oxygen to room air as patient tolerates. PT/OT Evaluation recommending HH/OT; -Ambulatory Home O2 Screen and improved and did not desaturate;  repeat chest x-ray below -Follow-up with PCP within 1 to 2 weeks repeat chest x-ray in 3 to 6 weeks  Metabolic Encephalopathy/Confusion in setting of Pneumonia: Improving and about baseline. Delirium Precautions   Elevated  Troponin 2/2 to demand ischemia in the context of pneumonia: Elevated troponin 46.  Checking second troponin level.  Concern for elevated troponin in the setting of demand ischemia in the context of sepsis and pneumonia.  EKG showing ectopic atrial rhythm heart rate 96.  There is no history of abnormality.  At this time there is no concern for ACS.  Obtained Echocardiogram to assess for any wall motion abnormality.  Continue cardiac monitoring.  Follow-up with cardiology outpatient setting  Hyponatremia: Mild and Improving. Na+ went from 133 -> 134 -> 139 and is now 140 the time of discharge. CTM and Replete a Necessary. Repeat CMP in the AM   Elevated BNP in the setting of Acute Diastolic Grade 2 CHF: Elevated BNP of 1,893.0 and likely worsened with all the IVF she got for Sepsis as above.  No previous echocardiogram in our system to assess baseline heart function but she does see Dr. Meldon of [facility]. Concern for elevated BNP in the setting of acute reactive process. Obtained ECHO and showed EF of 60-65% w/ No RWMA, mild concentric LVH, G2DD, and a RVSF that was low normal. Her LA, and RA were moderately dilated and noted to have severe Severe Tricuspid Stenosis. Strict I's and O's and Daily Decker. Will give a dose of IV Furosemide  40 mg x1 again prior to discharge.  Repeat chest x-ray done and showed Small scattered opacities in the left base, possibly atelectasis or pneumonia. Mild cardiomegaly with central vascular prominence and no overt edema. -Ambulatory home O2 screen done and she did not desaturate -Will need F/U with Cardiology @ D/C  Tricuspid Stenosis: Severe on ECHO. Will need outpt Cardiology follow up and monitoring. She sees Dr. Meldon in [facility] and will need to F/U in the outpatient setting    Left-Sided Lower Chest wall pain secondary to fall: Patient reported left-sided lower chest wall pain with coughing since the fall.  Chest x-ray no evidence of  fracture or dislocation. Continue conservative management with Lidocaine  1 patch 5% TD daily, and Acetaminophen  650 mg po/RC q6h as needed for Mild Pain.  Continue IV Ketorolac  7.5-15 mg q6h as needed for severe pain control.  This is improved  Essential HTN: Had Sepsis on Admission so held home Antihypertensives w/ Amlodipine  10 mg po Daily, Hydrochlorothiazide 12.5 mg po Daily, Isosorbide 30 mg po Dailyprn, and Losartan  50 mg po Daily but will resume Losartan  and Amlodipine  today and likely the Imdur and Hydrochlorothiazide in the AM. CTM BP per Protocol. Last BP reading was 145/73   HLD: C/w Rosuvastatin  20 mg po Daily and Omega-3 Acid Etyhl Esters 1 gram po BID  Hypophosphatemia: Phos Level is now 2.8. CTM and Repelte as Necessary. Repeat Phos Level in the AM   Non-Insulin  Dependent DM Type II: At home patient is on Jardiance 10 mg po Daily and Metformin 1000 mg po BID which are currently held. C/w Very Sensitive Novolog  SSI AC/HS. CTM CBGs per Protcol. CBG Trend ranging from 158-271 the last 7 checks   Hypothyroidism: Check TSH in the AM. Continue Levothyroxine  75 mcg po Daily   Thrombocytopenia: Plt Count went from 191 -> 145 -> 137 and is now 152 at the time of this. No longer taking her Clopidogrel per MAR. CTM for  S/Sx of Bleeding; No overt bleeding noted. Repeat CBC in the AM  Hypoalbuminemia: Patient's Albumin Lvl went from 4.5 -> 3.4 -> 3.3. CTM & Trend & repeat CMP in the AM  Consultants: None Procedures performed: As delineated as above Disposition: Home health Diet recommendation:  Discharge Diet Orders (From admission, onward)     Start     Ordered   06/23/24 0000  Diet Carb Modified        06/23/24 1042   06/23/24 0000  Diet - low sodium heart healthy        06/23/24 1042           Cardiac and Carb modified diet DISCHARGE MEDICATION: Allergies as of 06/23/2024       Reactions   Penicillins Itching, Other (See Comments)   In groin area only.         Medication List     STOP taking these medications    fluconazole 150 MG tablet Commonly known as: DIFLUCAN   meloxicam 15 MG tablet Commonly known as: MOBIC       TAKE these medications    acetaminophen  325 MG tablet Commonly known as: TYLENOL  Take 2 tablets (650 mg total) by mouth every 6 (six) hours as needed for mild pain (pain score 1-3) or fever (or Fever >/= 101).   amLODipine  10 MG tablet Commonly known as: NORVASC  Take 10 mg by mouth daily.   Aspirin  81 MG EC tablet Take 81 mg by mouth daily.   cefdinir  300 MG capsule Commonly known as: OMNICEF  Take 1 capsule (300 mg total) by mouth 2 (two) times daily for 4 days.   clopidogrel 75 MG tablet Commonly known as: PLAVIX Take 75 mg by mouth daily.   docusate sodium  100 MG capsule Commonly known as: COLACE Take 1 capsule (100 mg total) by mouth 2 (two) times daily.   doxycycline  100 MG tablet Commonly known as: VIBRA -TABS Take 1 tablet (100 mg total) by mouth 2 (two) times daily for 4 days.   fish oil-omega-3 fatty acids 1000 MG capsule Take 1 g by mouth daily.   guaiFENesin  600 MG 12 hr tablet Commonly known as: MUCINEX  Take 1 tablet (600 mg total) by mouth 2 (two) times daily for 5 days.   hydrochlorothiazide 12.5 MG tablet Commonly known as: HYDRODIURIL Take 12.5 mg by mouth daily.   isosorbide mononitrate 30 MG 24 hr tablet Commonly known as: IMDUR Take 30 mg by mouth daily as needed (chest pain).   Jardiance 10 MG Tabs tablet Generic drug: empagliflozin Take 10 mg by mouth daily.   levothyroxine  75 MCG tablet Commonly known as: SYNTHROID  Take 75 mcg by mouth daily before breakfast.   lidocaine  5 % Commonly known as: LIDODERM  Place 1 patch onto the skin daily. Remove & Discard patch within 12 hours or as directed by MD   losartan  50 MG tablet Commonly known as: COZAAR  Take 50 mg by mouth daily.   metFORMIN 1000 MG tablet Commonly known as: GLUCOPHAGE Take 1,000 mg by mouth 2 (two)  times daily.   ondansetron  4 MG tablet Commonly known as: ZOFRAN  Take 1 tablet (4 mg total) by mouth every 6 (six) hours as needed for nausea.   rosuvastatin  20 MG tablet Commonly known as: CRESTOR  Take 20 mg by mouth daily.   tiZANidine 2 MG tablet Commonly known as: ZANAFLEX Take 2 mg by mouth every 6 (six) hours as needed for muscle spasms.   Vitamin D3 25 MCG (1000 UT) Caps Take  1 capsule by mouth every morning.        Contact information for after-discharge care     Home Medical Care     [facility]) .   Service: Home Health Services Contact information: [address] [place]  [address] [phone]                    Discharge Exam: Destiny Decker   06/20/24 1659  Weight: 68 kg   Vitals:   06/23/24 1007 06/23/24 1318  BP:  134/64  Pulse:  60  Resp:  18  Temp:  98.1 F (36.7 C)  SpO2: 96% 95%   Examination: Physical Exam:  Constitutional: WN/WD elderly Caucasian female in no acute distress Respiratory: Diminished to auscultation bilaterally with some coarse breath sounds and has some rhonchi and some crackles but no appreciable wheezing or rales. Normal respiratory effort and patient is not tachypenic. No accessory muscle use.  Unlabored breathing and not wearing supplemental oxygen nasal cannula Cardiovascular: RRR, no murmurs / rubs / gallops. S1 and S2 auscultated.  Mild extremity edema Abdomen: Soft, non-tender, not really distended. Bowel sounds positive.  GU: Deferred. Musculoskeletal: No clubbing / cyanosis of digits/nails. No joint deformity upper and lower extremities.  Skin: No rashes, lesions, ulcers on limited skin evaluation. No induration; Warm and dry.  Neurologic: CN 2-12 grossly intact with no focal deficits. Romberg sign and cerebellar reflexes not assessed.  Psychiatric: Normal judgment and insight. Alert and oriented x 3. Normal mood and appropriate affect.   Condition at  discharge: stable  The results of significant diagnostics from this hospitalization (including imaging, microbiology, ancillary and laboratory) are listed below for reference.   Imaging Studies: DG CHEST PORT 1 VIEW Result Date: 06/23/2024 EXAM: 1 VIEW(S) XRAY OF THE CHEST 06/23/2024 04:30:00 AM COMPARISON: Portable chest dated 06/22/2024 04:37:00 AM. CLINICAL HISTORY: SOB (shortness of breath) FINDINGS: LUNGS AND PLEURA: There are relatively low lung volumes with small scattered opacities in the left base which could be atelectasis or pneumonia. The remaining lungs appear clear. No pleural effusion. No pneumothorax. HEART AND MEDIASTINUM: There is mild cardiomegaly. Central vascular prominence continues to be seen without overt edema. The mediastinum is stable with aortic atherosclerosis. BONES AND SOFT TISSUES: No acute osseous abnormality. IMPRESSION: 1. Small scattered opacities in the left base, possibly atelectasis or pneumonia. 2. Mild cardiomegaly with central vascular prominence and no overt edema. Electronically signed by: Francis Quam MD 06/23/2024 07:01 AM EST RP Workstation: [id number]   DG CHEST PORT 1 VIEW Result Date: 06/22/2024 CLINICAL DATA:  Shortness of breath. EXAM: PORTABLE CHEST 1 VIEW COMPARISON:  06/21/2024 FINDINGS: Cardiopericardial silhouette is at upper limits of normal for size. There is pulmonary vascular congestion without overt pulmonary airspace edema. No focal airspace consolidation or pleural effusion. No acute bony abnormality. Telemetry leads overlie the chest. IMPRESSION: Pulmonary vascular congestion without overt pulmonary airspace edema. Electronically Signed   By: Camellia Candle M.D.   On: 06/22/2024 07:26   ECHOCARDIOGRAM COMPLETE Result Date: 06/21/2024    ECHOCARDIOGRAM REPORT   Patient Name:   Destiny Decker Date of Exam: 06/21/2024 Medical Rec #:  [id number]  Height:       66.0 in Accession #:    [id number] Weight:       150.0 lb Date of Birth:  09-10-1945   BSA:          1.770 m Patient Age:    78 years   BP:  125/63 mmHg Patient Gender: F          HR:           59 bpm. Exam Location:  Inpatient Procedure: 2D Echo, Cardiac Doppler and Color Doppler (Both Spectral and Color            Flow Doppler were utilized during procedure). Indications:    R06.02 SOB. ; I26.02 Pulmonary embolus. Elevated BNP  History:        Patient has no prior history of Echocardiogram examinations.                 Signs/Symptoms:Fever; Risk Factors:Hypertension and Diabetes.                 Pneumonia.  Sonographer:    Ellouise Mose RDCS Referring Phys: [id number] SUBRINA SUNDIL IMPRESSIONS  1. Left ventricular ejection fraction, by estimation, is 60 to 65%. Left ventricular ejection fraction by PLAX is 65 %. The left ventricle has normal function. The left ventricle has no regional wall motion abnormalities. There is mild concentric left ventricular hypertrophy. Left ventricular diastolic parameters are consistent with Grade II diastolic dysfunction (pseudonormalization).  2. Right ventricular systolic function is low normal. The right ventricular size is mildly enlarged. There is normal pulmonary artery systolic pressure. The estimated right ventricular systolic pressure is 35.1 mmHg.  3. Left atrial size was moderately dilated.  4. Right atrial size was moderately dilated.  5. The mitral valve is normal in structure. Trivial mitral valve regurgitation. No evidence of mitral stenosis.  6. Severe tricuspid stenosis.  7. The aortic valve is tricuspid. There is mild calcification of the aortic valve. Aortic valve regurgitation is trivial. Aortic valve sclerosis is present, with no evidence of aortic valve stenosis.  8. The inferior vena cava is dilated in size with <50% respiratory variability, suggesting right atrial pressure of 15 mmHg. Comparison(s): No prior Echocardiogram. FINDINGS  Left Ventricle: Left ventricular ejection fraction, by estimation, is 60 to 65%. Left ventricular ejection  fraction by PLAX is 65 %. The left ventricle has normal function. The left ventricle has no regional wall motion abnormalities. The left ventricular internal cavity size was normal in size. There is mild concentric left ventricular hypertrophy. Left ventricular diastolic parameters are consistent with Grade II diastolic dysfunction (pseudonormalization). Right Ventricle: The right ventricular size is mildly enlarged. No increase in right ventricular wall thickness. Right ventricular systolic function is low normal. There is normal pulmonary artery systolic pressure. The tricuspid regurgitant velocity is 2.24 m/s, and with an assumed right atrial pressure of 15 mmHg, the estimated right ventricular systolic pressure is 35.1 mmHg. Left Atrium: Left atrial size was moderately dilated. Right Atrium: Right atrial size was moderately dilated. Pericardium: There is no evidence of pericardial effusion. Mitral Valve: The mitral valve is normal in structure. Mild mitral annular calcification. Trivial mitral valve regurgitation. No evidence of mitral valve stenosis. Tricuspid Valve: The tricuspid valve is normal in structure. Tricuspid valve regurgitation is mild . Severe tricuspid stenosis. Aortic Valve: The aortic valve is tricuspid. There is mild calcification of the aortic valve. Aortic valve regurgitation is trivial. Aortic valve sclerosis is present, with no evidence of aortic valve stenosis. Pulmonic Valve: The pulmonic valve was normal in structure. Pulmonic valve regurgitation is trivial. No evidence of pulmonic stenosis. Aorta: The aortic root and ascending aorta are structurally normal, with no evidence of dilitation. Venous: The inferior vena cava is dilated in size with less than 50% respiratory variability, suggesting right atrial pressure of 15  mmHg. IAS/Shunts: No atrial level shunt detected by color flow Doppler.  LEFT VENTRICLE PLAX 2D LV EF:         Left            Diastology                ventricular      LV e' medial:    5.44 cm/s                ejection        LV E/e' medial:  16.4                fraction by     LV e' lateral:   6.20 cm/s                PLAX is 65      LV E/e' lateral: 14.4                %. LVIDd:         4.35 cm LVIDs:         2.80 cm LV PW:         0.95 cm LV IVS:        1.20 cm LVOT diam:     2.40 cm LV SV:         123 LV SV Index:   70 LVOT Area:     4.52 cm  LV Volumes (MOD) LV vol d, MOD    85.2 ml A2C: LV vol d, MOD    48.7 ml A4C: LV vol s, MOD    22.0 ml A2C: LV vol s, MOD    18.2 ml A4C: LV SV MOD A2C:   63.2 ml LV SV MOD A4C:   48.7 ml LV SV MOD BP:    43.8 ml RIGHT VENTRICLE         IVC TAPSE (M-mode): 2.3 cm  IVC diam: 2.30 cm LEFT ATRIUM             Index        RIGHT ATRIUM           Index LA diam:        3.60 cm 2.03 cm/m   RA Area:     17.10 cm LA Vol (A2C):   40.7 ml 23.00 ml/m  RA Volume:   47.60 ml  26.90 ml/m LA Vol (A4C):   40.0 ml 22.60 ml/m LA Biplane Vol: 40.6 ml 22.94 ml/m  AORTIC VALVE LVOT Vmax:   133.00 cm/s LVOT Vmean:  89.600 cm/s LVOT VTI:    0.272 m  AORTA Ao Root diam: 3.30 cm Ao Asc diam:  3.40 cm MITRAL VALVE               TRICUSPID VALVE MV Area (PHT): 3.08 cm    TR Peak grad:   20.1 mmHg MV Decel Time: 246 msec    TR Vmax:        224.00 cm/s MV E velocity: 89.20 cm/s MV A velocity: 80.40 cm/s  SHUNTS MV E/A ratio:  1.11        Systemic VTI:  0.27 m                            Systemic Diam: 2.40 cm Toribio Fuel MD Electronically signed by Toribio Fuel MD Signature Date/Time: 06/21/2024/7:45:35 PM    Final    DG CHEST PORT 1 VIEW Result Date:  06/21/2024 EXAM: 1 VIEW(S) XRAY OF THE CHEST 06/21/2024 10:00:56 AM COMPARISON: 06/20/2024 CLINICAL HISTORY: SOB (shortness of breath) FINDINGS: LUNGS AND PLEURA: Streaky left lung base opacity, overall similar to prior. No pleural effusion. No pneumothorax. HEART AND MEDIASTINUM: Aortic atherosclerosis. No acute abnormality of the cardiac and mediastinal silhouettes. BONES AND SOFT TISSUES: No acute  osseous abnormality. IMPRESSION: 1. Unchanged left basilar opacity. Electronically signed by: Michaeline Blanch MD 06/21/2024 01:24 PM EST RP Workstation: [id number]   DG Chest 2 View Result Date: 06/20/2024 CLINICAL DATA:  Productive cough. EXAM: CHEST - 2 VIEW COMPARISON:  04/26/2006 FINDINGS: Patchy left lower lobe opacity suspicious for pneumonia. The heart is normal in size. Mediastinal contours are normal. No pulmonary edema, pleural effusion, or pneumothorax. No acute osseous findings. IMPRESSION: Patchy left lower lobe opacity suspicious for pneumonia. Electronically Signed   By: Andrea Gasman M.D.   On: 06/20/2024 18:56   Microbiology: Results for orders placed or performed during the hospital encounter of 06/20/24  Resp panel by RT-PCR (RSV, Flu A&B, Covid) Anterior Nasal Swab     Status: None   Collection Time: 06/20/24  5:02 PM   Specimen: Anterior Nasal Swab  Result Value Ref Range Status   SARS Coronavirus 2 by RT PCR NEGATIVE NEGATIVE Final    Comment: (NOTE) SARS-CoV-2 target nucleic acids are NOT DETECTED.  The SARS-CoV-2 RNA is generally detectable in upper respiratory specimens during the acute phase of infection. The lowest concentration of SARS-CoV-2 viral copies this assay can detect is 138 copies/mL. A negative result does not preclude SARS-Cov-2 infection and should not be used as the sole basis for treatment or other patient management decisions. A negative result may occur with  improper specimen collection/handling, submission of specimen other than nasopharyngeal swab, presence of viral mutation(s) within the areas targeted by this assay, and inadequate number of viral copies(<138 copies/mL). A negative result must be combined with clinical observations, patient history, and epidemiological information. The expected result is Negative.  Fact Sheet for Patients:  bloggercourse.com  Fact Sheet for Healthcare Providers:   seriousbroker.it  This test is no t yet approved or cleared by the [place]  FDA and  has been authorized for detection and/or diagnosis of SARS-CoV-2 by FDA under an Emergency Use Authorization (EUA). This EUA will remain  in effect (meaning this test can be used) for the duration of the COVID-19 declaration under Section 564(b)(1) of the Act, 21 U.S.C.section 360bbb-3(b)(1), unless the authorization is terminated  or revoked sooner.       Influenza A by PCR NEGATIVE NEGATIVE Final   Influenza B by PCR NEGATIVE NEGATIVE Final    Comment: (NOTE) The Xpert Xpress SARS-CoV-2/FLU/RSV plus assay is intended as an aid in the diagnosis of influenza from Nasopharyngeal swab specimens and should not be used as a sole basis for treatment. Nasal washings and aspirates are unacceptable for Xpert Xpress SARS-CoV-2/FLU/RSV testing.  Fact Sheet for Patients: bloggercourse.com  Fact Sheet for Healthcare Providers: seriousbroker.it  This test is not yet approved or cleared by the [place]  FDA and has been authorized for detection and/or diagnosis of SARS-CoV-2 by FDA under an Emergency Use Authorization (EUA). This EUA will remain in effect (meaning this test can be used) for the duration of the COVID-19 declaration under Section 564(b)(1) of the Act, 21 U.S.C. section 360bbb-3(b)(1), unless the authorization is terminated or revoked.     Resp Syncytial Virus by PCR NEGATIVE NEGATIVE Final    Comment: (NOTE) Fact Sheet for Patients: bloggercourse.com  Fact Sheet for Healthcare Providers: seriousbroker.it  This test is not yet approved or cleared by the [place]  FDA and has been authorized for detection and/or diagnosis of SARS-CoV-2 by FDA under an Emergency Use Authorization (EUA). This EUA will remain in effect (meaning this test can be used) for  the duration of the COVID-19 declaration under Section 564(b)(1) of the Act, 21 U.S.C. section 360bbb-3(b)(1), unless the authorization is terminated or revoked.  Performed at [facility], [address] [place], [place] [address]   Culture, blood (routine x 2)     Status: None   Collection Time: 06/20/24  7:03 PM   Specimen: BLOOD  Result Value Ref Range Status   Specimen Description BLOOD LEFT ANTECUBITAL  Final   Special Requests   Final    BOTTLES DRAWN AEROBIC AND ANAEROBIC Blood Culture adequate volume   Culture   Final    NO GROWTH 5 DAYS Performed at [facility], [address] [place], [place] [address]    Report Status 06/25/2024 FINAL  Final  Culture, blood (routine x 2)     Status: None   Collection Time: 06/20/24  8:00 PM   Specimen: BLOOD  Result Value Ref Range Status   Specimen Description BLOOD SITE NOT SPECIFIED  Final   Special Requests   Final    BOTTLES DRAWN AEROBIC AND ANAEROBIC Blood Culture adequate volume   Culture   Final    NO GROWTH 5 DAYS Performed at [facility], [address] [place], [place] [address]    Report Status 06/25/2024 FINAL  Final   Labs: CBC: Recent Labs  Lab 06/20/24 1703 06/21/24 0846 06/22/24 0603 06/23/24 0519  WBC 13.3* 11.1* 6.2 5.2  NEUTROABS  --  9.2* 4.6 3.7  HGB 15.8* 12.6 12.1 12.2  HCT 47.2* 38.1 36.2 36.7  MCV 85.4 86.6 86.4 86.2  PLT 191 145* 137* 152   Basic Metabolic Panel: Recent Labs  Lab 06/20/24 1703 06/21/24 0846 06/22/24 0603 06/23/24 0519  NA 133* 134* 139 140  K 3.8 3.7 3.7 3.5  CL 91* 98 103 103  CO2 25 26 27 26   GLUCOSE 187* 241* 120* 161*  BUN 18 23 18 14   CREATININE 0.97 0.85 0.55 0.52  CALCIUM  9.2 8.9 8.6* 9.1  MG  --  1.9 2.0 2.2  PHOS  --  2.6 1.9* 2.8   Liver Function Tests: Recent Labs  Lab 06/20/24 1703 06/21/24 0846 06/22/24 0603 06/23/24 0519  AST 37 41 38 42*  ALT 20 20 20 30   ALKPHOS 57 58 50 62  BILITOT 0.6 0.5 0.3 0.3  PROT 7.7  6.1* 6.0* 6.2*  ALBUMIN 4.5 3.4* 3.3* 3.5   CBG: Recent Labs  Lab 06/22/24 1152 06/22/24 1641 06/22/24 2136 06/23/24 0710 06/23/24 1116  GLUCAP 144* 153* 158* 158* 279*   Discharge time spent: greater than 30 minutes.  Signed: Alejandro Marker, DO [facility] 06/26/2024 "

## 2024-08-01 ENCOUNTER — Other Ambulatory Visit (HOSPITAL_COMMUNITY): Payer: Self-pay
